# Patient Record
Sex: Male | Born: 1935 | Hispanic: Refuse to answer | Marital: Married
Health system: Southern US, Community
[De-identification: ages and names within clinical notes are randomized; demographics above are authoritative.]

---

## 2004-02-03 ENCOUNTER — Ambulatory Visit: Payer: Self-pay

## 2004-03-02 ENCOUNTER — Ambulatory Visit: Payer: Self-pay | Admitting: Urology

## 2004-03-06 ENCOUNTER — Ambulatory Visit: Payer: Self-pay | Admitting: Urology

## 2004-03-16 ENCOUNTER — Other Ambulatory Visit: Payer: Self-pay

## 2004-03-17 ENCOUNTER — Inpatient Hospital Stay: Payer: Self-pay | Admitting: Urology

## 2004-03-21 ENCOUNTER — Inpatient Hospital Stay: Payer: Self-pay | Admitting: Urology

## 2004-03-31 ENCOUNTER — Ambulatory Visit: Payer: Self-pay | Admitting: Urology

## 2004-04-07 ENCOUNTER — Ambulatory Visit: Payer: Self-pay | Admitting: Urology

## 2004-08-07 ENCOUNTER — Ambulatory Visit: Payer: Self-pay | Admitting: Urology

## 2005-05-01 ENCOUNTER — Ambulatory Visit: Payer: Self-pay | Admitting: Orthopedic Surgery

## 2006-04-25 ENCOUNTER — Inpatient Hospital Stay: Payer: Self-pay | Admitting: Internal Medicine

## 2006-05-03 ENCOUNTER — Other Ambulatory Visit: Payer: Self-pay

## 2006-05-17 ENCOUNTER — Ambulatory Visit: Payer: Self-pay | Admitting: Radiation Oncology

## 2006-06-08 ENCOUNTER — Inpatient Hospital Stay: Payer: Self-pay | Admitting: Internal Medicine

## 2006-06-15 ENCOUNTER — Ambulatory Visit: Payer: Self-pay | Admitting: Internal Medicine

## 2006-06-15 ENCOUNTER — Ambulatory Visit: Payer: Self-pay | Admitting: Radiation Oncology

## 2006-06-17 ENCOUNTER — Emergency Department: Payer: Self-pay | Admitting: Emergency Medicine

## 2006-07-16 ENCOUNTER — Ambulatory Visit: Payer: Self-pay | Admitting: Radiation Oncology

## 2006-07-16 ENCOUNTER — Ambulatory Visit: Payer: Self-pay | Admitting: Internal Medicine

## 2006-08-15 ENCOUNTER — Ambulatory Visit: Payer: Self-pay | Admitting: Radiation Oncology

## 2006-08-15 ENCOUNTER — Ambulatory Visit: Payer: Self-pay | Admitting: Internal Medicine

## 2006-09-15 ENCOUNTER — Ambulatory Visit: Payer: Self-pay | Admitting: Internal Medicine

## 2006-09-15 ENCOUNTER — Ambulatory Visit: Payer: Self-pay | Admitting: Radiation Oncology

## 2006-10-03 ENCOUNTER — Ambulatory Visit: Payer: Self-pay | Admitting: Internal Medicine

## 2006-10-15 ENCOUNTER — Ambulatory Visit: Payer: Self-pay | Admitting: Radiation Oncology

## 2006-10-15 ENCOUNTER — Ambulatory Visit: Payer: Self-pay | Admitting: Internal Medicine

## 2006-11-15 ENCOUNTER — Ambulatory Visit: Payer: Self-pay | Admitting: Radiation Oncology

## 2006-11-15 ENCOUNTER — Ambulatory Visit: Payer: Self-pay | Admitting: Internal Medicine

## 2006-12-16 ENCOUNTER — Ambulatory Visit: Payer: Self-pay | Admitting: Internal Medicine

## 2006-12-16 ENCOUNTER — Ambulatory Visit: Payer: Self-pay | Admitting: Radiation Oncology

## 2007-01-15 ENCOUNTER — Ambulatory Visit: Payer: Self-pay | Admitting: Internal Medicine

## 2007-01-15 ENCOUNTER — Ambulatory Visit: Payer: Self-pay | Admitting: Radiation Oncology

## 2007-02-15 ENCOUNTER — Ambulatory Visit: Payer: Self-pay | Admitting: Radiation Oncology

## 2007-02-15 ENCOUNTER — Ambulatory Visit: Payer: Self-pay | Admitting: Internal Medicine

## 2007-03-17 ENCOUNTER — Ambulatory Visit: Payer: Self-pay | Admitting: Internal Medicine

## 2007-03-17 ENCOUNTER — Ambulatory Visit: Payer: Self-pay | Admitting: Radiation Oncology

## 2007-04-17 ENCOUNTER — Ambulatory Visit: Payer: Self-pay | Admitting: Radiation Oncology

## 2007-05-10 IMAGING — CT CT ABD-PELV W/O CM
1 of 2 series · 15 of 32 positions shown, 19 images · non-contrast
Comparison: none

REASON FOR EXAM: (1) nephrostomy tube malfunction; (2) nephrostomy tube
malfunction
COMMENTS:

PROCEDURE:     CT  - CT ABDOMEN AND PELVIS W[DATE]  [DATE]
RESULT:
COMPARISON STUDIES:   Prior exam of 04/29/06.

[Series 2: stone · axial · 0.71mm/px · z∈[-561,-195]mm · 15 of 137 slices shown, 19 images]
[im 10/137  soft-tissue]
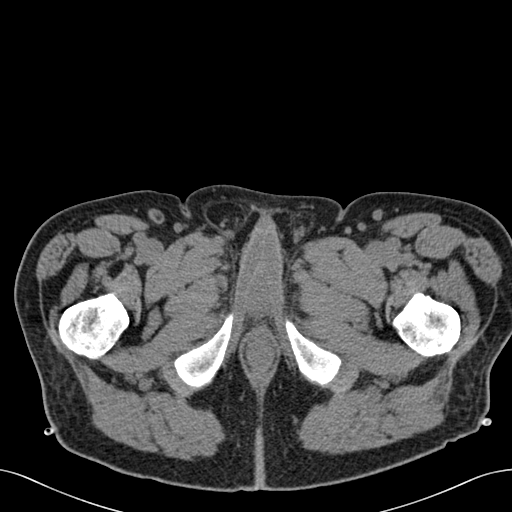
[im 10/137  bone]
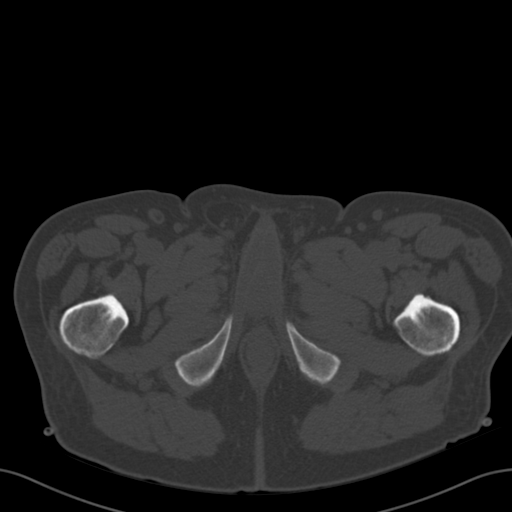
[im 19/137  soft-tissue]
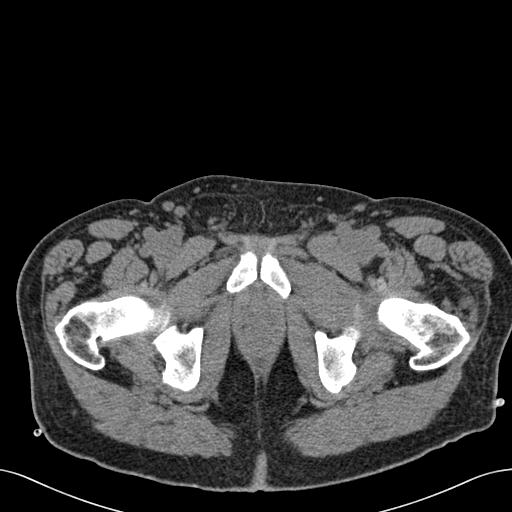
[im 29/137  soft-tissue]
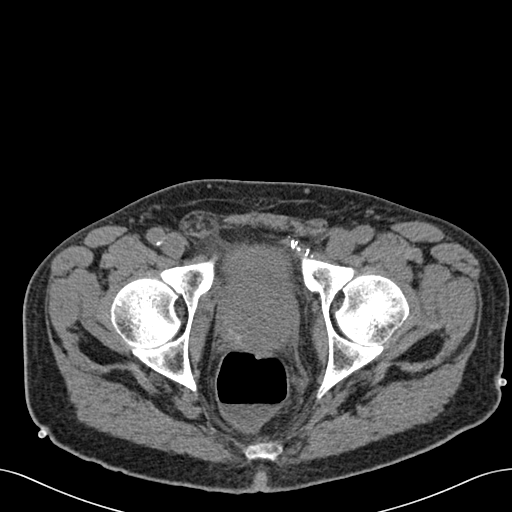
[im 38/137  soft-tissue]
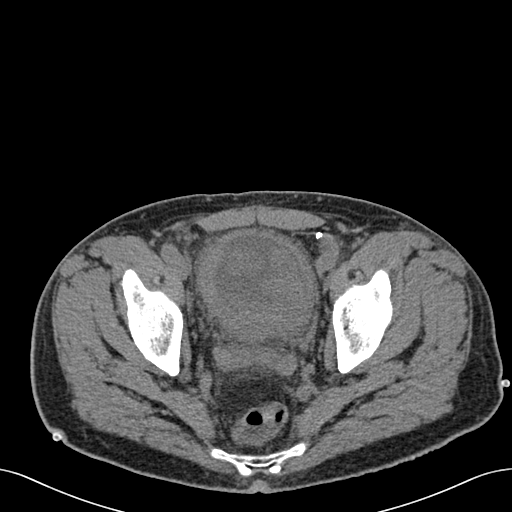
[im 47/137  soft-tissue]
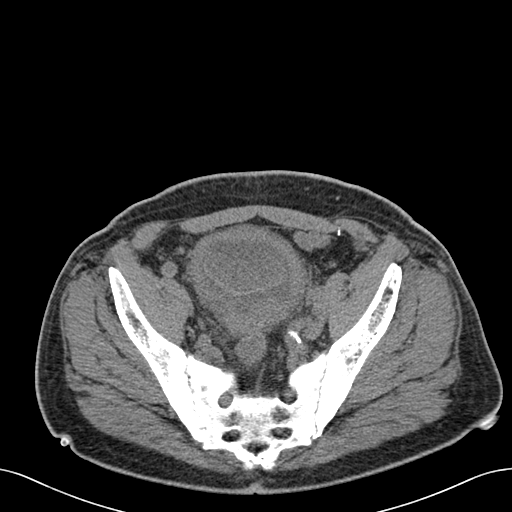
[im 57/137  soft-tissue]
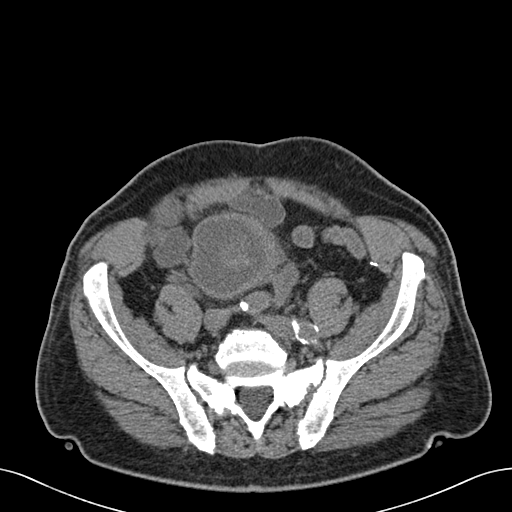
[im 71/137  soft-tissue]
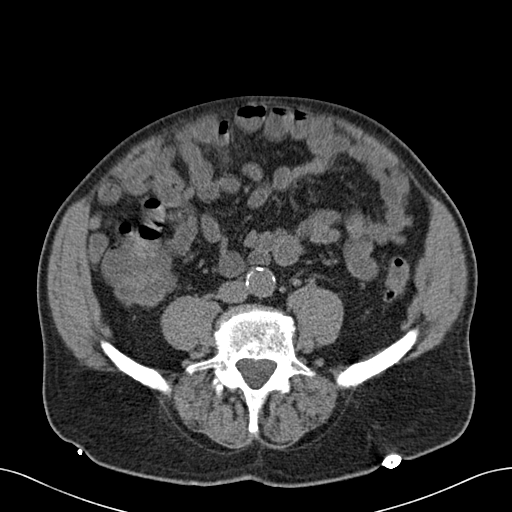
[im 80/137  soft-tissue]
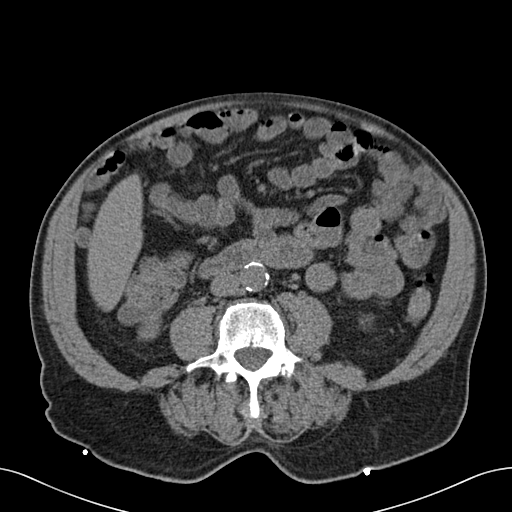
[im 90/137  soft-tissue]
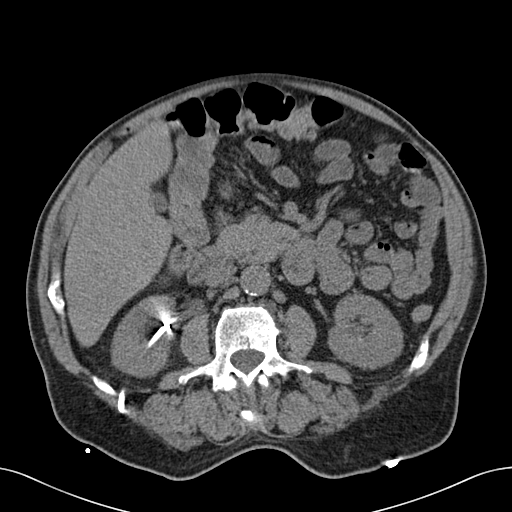
[im 90/137  bone]
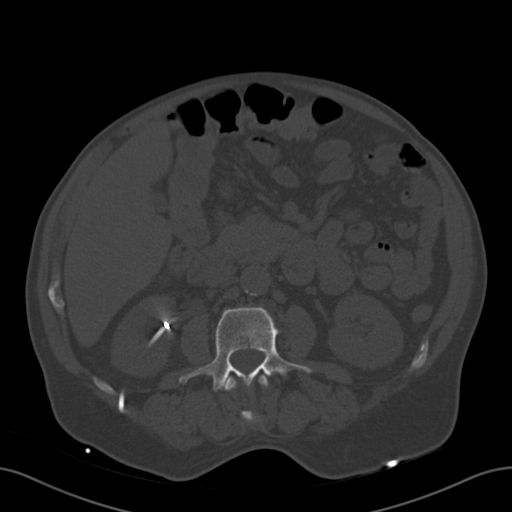
[im 99/137  soft-tissue]
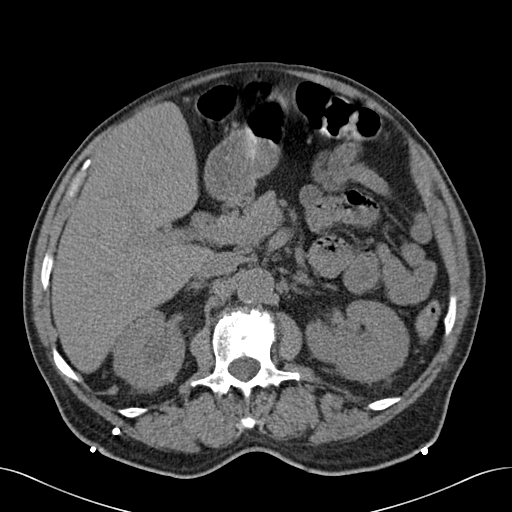
[im 108/137  soft-tissue]
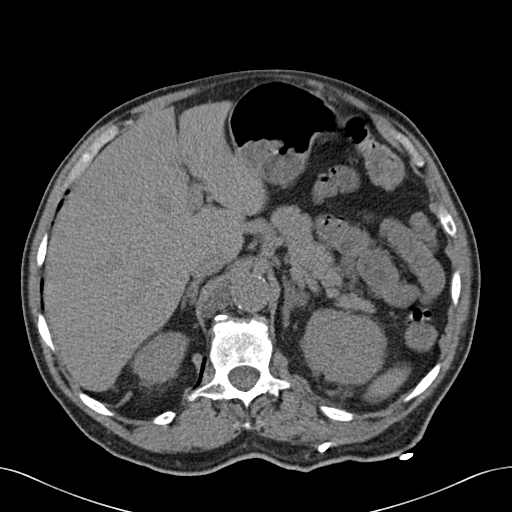
[im 118/137  soft-tissue]
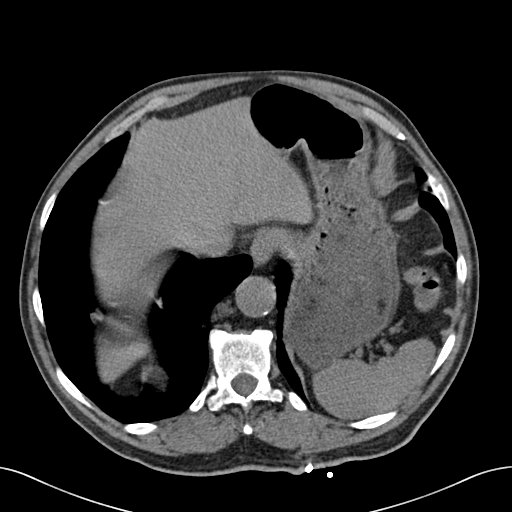
[im 118/137  lung]
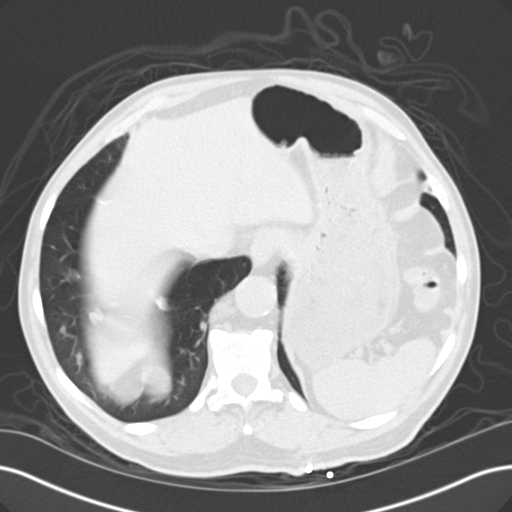
[im 122/137  lung]
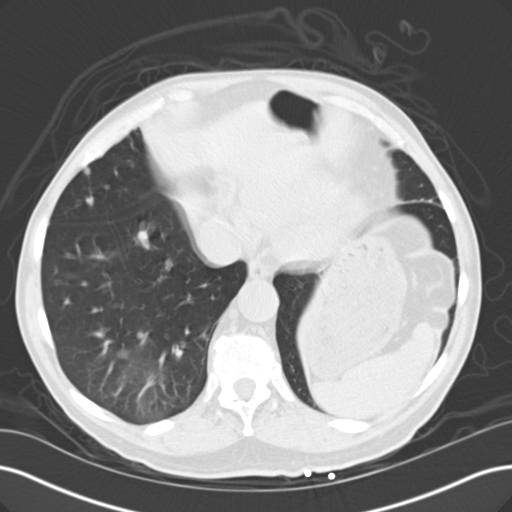
[im 127/137  soft-tissue]
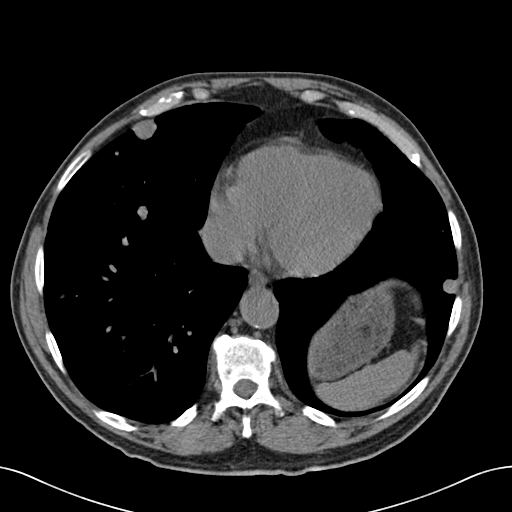
[im 127/137  lung]
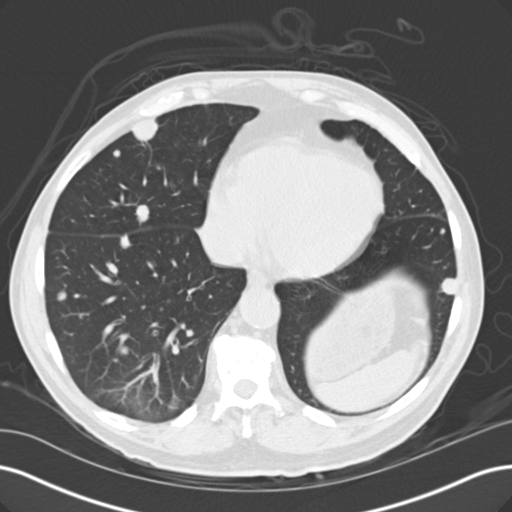
[im 132/137  lung]
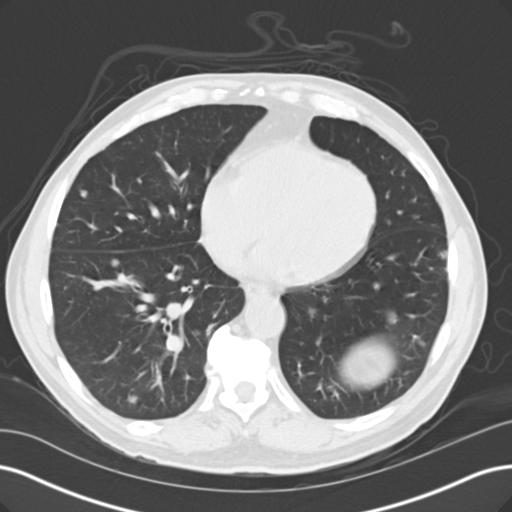

[15 of 32 positions shown; findings below may reference images not displayed]

FINDINGS: Again demonstrated are numerous bilateral pulmonary nodules the
largest of which may have decreased in size slightly when compared to the
prior exam.  A RIGHT middle lobe pulmonary nodule now measures 1.9 cm
compared to 2.5 cm on the prior exam.  A LEFT lower lobe pulmonary nodule
now measures 1.2 cm compared to 1.9 cm on the prior exam.

The unenhanced liver, spleen, pancreas and adrenal glands appear
unremarkable.

There has been interval placement of a RIGHT sided nephrostomy tube with
interval improvement in RIGHT sided hydronephrosis.  The LEFT sided
hydronephrosis has also improved significantly when compared to the prior
exam. Again demonstrated is high density within the bladder which may
represent tumor versus clot.  The bladder wall is thickened, unchanged.
There is no significant retroperitoneal lymphadenopathy.  Surgical clips in
the LEFT inguinal area are unchanged.  A large RIGHT retrocrural lymph node
is unchanged in size compared to the prior exam.  Osseous structures are
unchanged.
IMPRESSION: 1.     Interval placement of RIGHT sided nephrostomy tube with improvement
in bilateral hydronephrosis.
2.     Again demonstrated are extensive bilateral pulmonary nodules, the
largest of which are slightly decreased in size compared to the prior exam.
3.     Thickened bladder wall with extensive soft tissue within the bladder,
likely representing malignancy versus clot.

## 2007-05-18 ENCOUNTER — Ambulatory Visit: Payer: Self-pay | Admitting: Internal Medicine

## 2007-05-23 ENCOUNTER — Ambulatory Visit: Payer: Self-pay | Admitting: Internal Medicine

## 2007-06-15 ENCOUNTER — Ambulatory Visit: Payer: Self-pay | Admitting: Internal Medicine

## 2007-07-16 ENCOUNTER — Ambulatory Visit: Payer: Self-pay | Admitting: Internal Medicine

## 2007-08-13 ENCOUNTER — Other Ambulatory Visit: Payer: Self-pay

## 2007-08-13 ENCOUNTER — Ambulatory Visit: Payer: Self-pay | Admitting: Urology

## 2007-08-18 ENCOUNTER — Ambulatory Visit: Payer: Self-pay | Admitting: Urology

## 2007-09-15 ENCOUNTER — Ambulatory Visit: Payer: Self-pay | Admitting: Internal Medicine

## 2007-10-01 ENCOUNTER — Ambulatory Visit: Payer: Self-pay | Admitting: Internal Medicine

## 2007-10-15 ENCOUNTER — Ambulatory Visit: Payer: Self-pay | Admitting: Internal Medicine

## 2007-12-15 ENCOUNTER — Ambulatory Visit: Payer: Self-pay | Admitting: Urology

## 2007-12-16 ENCOUNTER — Ambulatory Visit: Payer: Self-pay | Admitting: Internal Medicine

## 2007-12-30 ENCOUNTER — Ambulatory Visit: Payer: Self-pay | Admitting: Internal Medicine

## 2008-01-15 ENCOUNTER — Ambulatory Visit: Payer: Self-pay | Admitting: Internal Medicine

## 2008-04-16 ENCOUNTER — Ambulatory Visit: Payer: Self-pay | Admitting: Internal Medicine

## 2008-05-11 ENCOUNTER — Ambulatory Visit: Payer: Self-pay | Admitting: Internal Medicine

## 2008-05-17 ENCOUNTER — Ambulatory Visit: Payer: Self-pay | Admitting: Internal Medicine

## 2008-06-14 ENCOUNTER — Ambulatory Visit: Payer: Self-pay | Admitting: Internal Medicine

## 2008-06-23 ENCOUNTER — Ambulatory Visit: Payer: Self-pay | Admitting: Internal Medicine

## 2008-06-29 ENCOUNTER — Ambulatory Visit: Payer: Self-pay | Admitting: Urology

## 2008-07-05 ENCOUNTER — Ambulatory Visit: Payer: Self-pay | Admitting: Urology

## 2008-07-15 ENCOUNTER — Ambulatory Visit: Payer: Self-pay | Admitting: Internal Medicine

## 2008-09-14 ENCOUNTER — Ambulatory Visit: Payer: Self-pay | Admitting: Internal Medicine

## 2008-09-22 ENCOUNTER — Ambulatory Visit: Payer: Self-pay | Admitting: Internal Medicine

## 2008-10-14 ENCOUNTER — Ambulatory Visit: Payer: Self-pay | Admitting: Internal Medicine

## 2009-01-14 ENCOUNTER — Ambulatory Visit: Payer: Self-pay | Admitting: Internal Medicine

## 2009-01-27 ENCOUNTER — Ambulatory Visit: Payer: Self-pay | Admitting: Internal Medicine

## 2009-02-14 ENCOUNTER — Ambulatory Visit: Payer: Self-pay | Admitting: Internal Medicine

## 2009-04-16 ENCOUNTER — Ambulatory Visit: Payer: Self-pay | Admitting: Internal Medicine

## 2009-05-12 ENCOUNTER — Ambulatory Visit: Payer: Self-pay | Admitting: Internal Medicine

## 2009-05-17 ENCOUNTER — Ambulatory Visit: Payer: Self-pay | Admitting: Internal Medicine

## 2009-05-27 ENCOUNTER — Ambulatory Visit: Payer: Self-pay | Admitting: Internal Medicine

## 2009-06-01 ENCOUNTER — Ambulatory Visit: Payer: Self-pay | Admitting: Internal Medicine

## 2009-06-09 ENCOUNTER — Ambulatory Visit: Payer: Self-pay | Admitting: Internal Medicine

## 2009-06-14 ENCOUNTER — Ambulatory Visit: Payer: Self-pay | Admitting: Internal Medicine

## 2009-06-15 ENCOUNTER — Inpatient Hospital Stay: Payer: Self-pay | Admitting: Internal Medicine

## 2009-06-29 ENCOUNTER — Ambulatory Visit: Payer: Self-pay | Admitting: Urology

## 2009-07-04 ENCOUNTER — Ambulatory Visit: Payer: Self-pay | Admitting: Urology

## 2009-07-15 ENCOUNTER — Ambulatory Visit: Payer: Self-pay | Admitting: Internal Medicine

## 2009-08-14 ENCOUNTER — Ambulatory Visit: Payer: Self-pay | Admitting: Internal Medicine

## 2009-09-14 ENCOUNTER — Ambulatory Visit: Payer: Self-pay | Admitting: Internal Medicine

## 2009-10-14 ENCOUNTER — Ambulatory Visit: Payer: Self-pay | Admitting: Internal Medicine

## 2009-10-26 LAB — PSA

## 2009-11-14 ENCOUNTER — Ambulatory Visit: Payer: Self-pay | Admitting: Internal Medicine

## 2010-01-03 ENCOUNTER — Ambulatory Visit: Payer: Self-pay | Admitting: Urology

## 2010-01-09 ENCOUNTER — Ambulatory Visit: Payer: Self-pay | Admitting: Urology

## 2010-01-10 ENCOUNTER — Ambulatory Visit: Payer: Self-pay | Admitting: Cardiovascular Disease

## 2010-01-31 ENCOUNTER — Ambulatory Visit: Payer: Self-pay | Admitting: Internal Medicine

## 2010-02-14 ENCOUNTER — Ambulatory Visit: Payer: Self-pay | Admitting: Internal Medicine

## 2010-05-02 ENCOUNTER — Ambulatory Visit: Payer: Self-pay | Admitting: Internal Medicine

## 2010-05-17 ENCOUNTER — Ambulatory Visit: Payer: Self-pay | Admitting: Internal Medicine

## 2010-06-22 ENCOUNTER — Ambulatory Visit: Payer: Self-pay | Admitting: Urology

## 2010-06-26 ENCOUNTER — Ambulatory Visit: Payer: Self-pay | Admitting: Urology

## 2010-08-31 ENCOUNTER — Ambulatory Visit: Payer: Self-pay | Admitting: Internal Medicine

## 2010-09-15 ENCOUNTER — Ambulatory Visit: Payer: Self-pay | Admitting: Internal Medicine

## 2010-10-25 ENCOUNTER — Ambulatory Visit: Payer: Self-pay | Admitting: Internal Medicine

## 2010-11-01 ENCOUNTER — Ambulatory Visit: Payer: Self-pay | Admitting: Gynecologic Oncology

## 2010-11-07 ENCOUNTER — Ambulatory Visit: Payer: Self-pay | Admitting: Internal Medicine

## 2010-11-15 ENCOUNTER — Ambulatory Visit: Payer: Self-pay | Admitting: Gynecologic Oncology

## 2010-11-22 ENCOUNTER — Ambulatory Visit: Payer: Self-pay | Admitting: Internal Medicine

## 2010-12-16 ENCOUNTER — Ambulatory Visit: Payer: Self-pay | Admitting: Gynecologic Oncology

## 2011-01-15 ENCOUNTER — Ambulatory Visit: Payer: Self-pay | Admitting: Internal Medicine

## 2011-01-15 ENCOUNTER — Ambulatory Visit: Payer: Self-pay | Admitting: Gynecologic Oncology

## 2011-01-18 ENCOUNTER — Ambulatory Visit: Payer: Self-pay | Admitting: Urology

## 2011-01-18 DIAGNOSIS — I10 Essential (primary) hypertension: Secondary | ICD-10-CM

## 2011-01-22 ENCOUNTER — Ambulatory Visit: Payer: Self-pay | Admitting: Urology

## 2011-02-15 ENCOUNTER — Ambulatory Visit: Payer: Self-pay | Admitting: Gynecologic Oncology

## 2011-02-15 ENCOUNTER — Ambulatory Visit: Payer: Self-pay | Admitting: Internal Medicine

## 2011-02-19 ENCOUNTER — Ambulatory Visit: Payer: Self-pay | Admitting: Vascular Surgery

## 2011-03-09 ENCOUNTER — Ambulatory Visit: Payer: Self-pay | Admitting: Urology

## 2011-03-09 ENCOUNTER — Inpatient Hospital Stay: Payer: Self-pay | Admitting: Internal Medicine

## 2011-03-17 ENCOUNTER — Ambulatory Visit: Payer: Self-pay | Admitting: Gynecologic Oncology

## 2011-03-17 ENCOUNTER — Ambulatory Visit: Payer: Self-pay | Admitting: Internal Medicine

## 2011-04-17 ENCOUNTER — Ambulatory Visit: Payer: Self-pay | Admitting: Gynecologic Oncology

## 2011-04-17 ENCOUNTER — Ambulatory Visit: Payer: Self-pay | Admitting: Internal Medicine

## 2011-04-19 LAB — COMPREHENSIVE METABOLIC PANEL
Alkaline Phosphatase: 97 U/L (ref 50–136)
Anion Gap: 4 — ABNORMAL LOW (ref 7–16)
BUN: 16 mg/dL (ref 7–18)
Bilirubin,Total: 0.3 mg/dL (ref 0.2–1.0)
Calcium, Total: 8.9 mg/dL (ref 8.5–10.1)
Co2: 36 mmol/L — ABNORMAL HIGH (ref 21–32)
EGFR (Non-African Amer.): >60
Glucose: 103 mg/dL — ABNORMAL HIGH (ref 65–99)
Osmolality: 283 (ref 275–301)
Potassium: 3.7 mmol/L (ref 3.5–5.1)
SGPT (ALT): 15 U/L
Sodium: 141 mmol/L (ref 136–145)
Total Protein: 7.3 g/dL (ref 6.4–8.2)

## 2011-04-19 LAB — CBC CANCER CENTER
Basophil %: 0.2 %
Eosinophil %: 0.7 %
HCT: 34.4 % — ABNORMAL LOW (ref 40.0–52.0)
Lymphocyte %: 11.6 %
MCH: 32.1 pg (ref 26.0–34.0)
MCHC: 33 g/dL (ref 32.0–36.0)
MCV: 97 fL (ref 80–100)
Monocyte #: 1 x10 3/mm — ABNORMAL HIGH (ref 0.0–0.7)
Neutrophil %: 75.4 %
RDW: 18.9 % — ABNORMAL HIGH (ref 11.5–14.5)

## 2011-05-10 LAB — COMPREHENSIVE METABOLIC PANEL
Alkaline Phosphatase: 82 U/L (ref 50–136)
Anion Gap: 5 — ABNORMAL LOW (ref 7–16)
Bilirubin,Total: 0.3 mg/dL (ref 0.2–1.0)
Calcium, Total: 9.1 mg/dL (ref 8.5–10.1)
Co2: 33 mmol/L — ABNORMAL HIGH (ref 21–32)
Creatinine: 1.49 mg/dL — ABNORMAL HIGH (ref 0.60–1.30)
EGFR (African American): 59 — ABNORMAL LOW
EGFR (Non-African Amer.): 48 — ABNORMAL LOW
Glucose: 100 mg/dL — ABNORMAL HIGH (ref 65–99)
Osmolality: 283 (ref 275–301)
Potassium: 4.1 mmol/L (ref 3.5–5.1)
SGOT(AST): 15 U/L (ref 15–37)
Sodium: 139 mmol/L (ref 136–145)

## 2011-05-10 LAB — CBC CANCER CENTER
Basophil %: 0.6 %
Eosinophil #: 0 x10 3/mm (ref 0.0–0.7)
HCT: 34.6 % — ABNORMAL LOW (ref 40.0–52.0)
HGB: 11.6 g/dL — ABNORMAL LOW (ref 13.0–18.0)
Lymphocyte #: 0.9 x10 3/mm — ABNORMAL LOW (ref 1.0–3.6)
MCH: 32.4 pg (ref 26.0–34.0)
MCHC: 33.6 g/dL (ref 32.0–36.0)
Monocyte #: 0.8 x10 3/mm — ABNORMAL HIGH (ref 0.0–0.7)
Monocyte %: 11.9 %
Neutrophil #: 5 x10 3/mm (ref 1.4–6.5)

## 2011-05-14 LAB — CREATININE, SERUM
Creatinine: 1.52 mg/dL — ABNORMAL HIGH (ref 0.60–1.30)
EGFR (African American): 57 — ABNORMAL LOW
EGFR (Non-African Amer.): 47 — ABNORMAL LOW

## 2011-05-18 ENCOUNTER — Ambulatory Visit: Payer: Self-pay | Admitting: Internal Medicine

## 2011-05-28 LAB — CBC CANCER CENTER
Basophil #: 0 x10 3/mm (ref 0.0–0.1)
Basophil %: 0.2 %
Eosinophil #: 0.1 x10 3/mm (ref 0.0–0.7)
HCT: 35.2 % — ABNORMAL LOW (ref 40.0–52.0)
HGB: 11.6 g/dL — ABNORMAL LOW (ref 13.0–18.0)
Lymphocyte %: 17.2 %
MCH: 31.8 pg (ref 26.0–34.0)
MCHC: 33 g/dL (ref 32.0–36.0)
MCV: 96 fL (ref 80–100)
Neutrophil %: 65.7 %
RBC: 3.65 10*6/uL — ABNORMAL LOW (ref 4.40–5.90)
RDW: 17.1 % — ABNORMAL HIGH (ref 11.5–14.5)
WBC: 5.2 x10 3/mm (ref 3.8–10.6)

## 2011-05-28 LAB — COMPREHENSIVE METABOLIC PANEL
Albumin: 3.4 g/dL (ref 3.4–5.0)
Alkaline Phosphatase: 75 U/L (ref 50–136)
Anion Gap: 8 (ref 7–16)
BUN: 39 mg/dL — ABNORMAL HIGH (ref 7–18)
Bilirubin,Total: 0.4 mg/dL (ref 0.2–1.0)
Creatinine: 1.74 mg/dL — ABNORMAL HIGH (ref 0.60–1.30)
Glucose: 87 mg/dL (ref 65–99)
Osmolality: 286 (ref 275–301)
Potassium: 4.4 mmol/L (ref 3.5–5.1)
Sodium: 139 mmol/L (ref 136–145)
Total Protein: 8.2 g/dL (ref 6.4–8.2)

## 2011-06-11 ENCOUNTER — Inpatient Hospital Stay: Payer: Self-pay | Admitting: Internal Medicine

## 2011-06-11 LAB — LACTATE DEHYDROGENASE: LDH: 114 U/L (ref 87–241)

## 2011-06-11 LAB — BASIC METABOLIC PANEL
Anion Gap: 12 (ref 7–16)
BUN: 94 mg/dL — ABNORMAL HIGH (ref 7–18)
BUN: 88 mg/dL — ABNORMAL HIGH (ref 7–18)
Chloride: 104 mmol/L (ref 98–107)
Co2: 24 mmol/L (ref 21–32)
Creatinine: 4.04 mg/dL — ABNORMAL HIGH (ref 0.60–1.30)
Creatinine: 3.8 mg/dL — ABNORMAL HIGH (ref 0.60–1.30)
EGFR (African American): 20 — ABNORMAL LOW
EGFR (Non-African Amer.): 15 — ABNORMAL LOW
Glucose: 95 mg/dL (ref 65–99)
Potassium: 4.8 mmol/L (ref 3.5–5.1)
Sodium: 137 mmol/L (ref 136–145)
Sodium: 140 mmol/L (ref 136–145)

## 2011-06-11 LAB — LIPID PANEL
Cholesterol: 137 mg/dL (ref 0–200)
Ldl Cholesterol, Calc: 80 mg/dL (ref 0–100)
Triglycerides: 45 mg/dL (ref 0–200)

## 2011-06-11 LAB — CBC CANCER CENTER
Basophil #: 0 x10 3/mm (ref 0.0–0.1)
Eosinophil %: 1.9 %
HGB: 11.2 g/dL — ABNORMAL LOW (ref 13.0–18.0)
Lymphocyte %: 11.7 %
Monocyte %: 16 %
Neutrophil #: 3.6 x10 3/mm (ref 1.4–6.5)
Neutrophil %: 69.9 %
Platelet: 313 x10 3/mm (ref 150–440)
RBC: 3.49 10*6/uL — ABNORMAL LOW (ref 4.40–5.90)
WBC: 5.1 x10 3/mm (ref 3.8–10.6)

## 2011-06-11 LAB — HEPATIC FUNCTION PANEL A (ARMC)
Albumin: 2.9 g/dL — ABNORMAL LOW (ref 3.4–5.0)
Alkaline Phosphatase: 78 U/L (ref 50–136)
SGOT(AST): 23 U/L (ref 15–37)
SGPT (ALT): 28 U/L
Total Protein: 8.1 g/dL (ref 6.4–8.2)

## 2011-06-11 LAB — URIC ACID: Uric Acid: 6.4 mg/dL (ref 3.5–7.2)

## 2011-06-11 LAB — HEMOGLOBIN A1C: Hemoglobin A1C: 5.9 % (ref 4.2–6.3)

## 2011-06-12 LAB — BASIC METABOLIC PANEL
Calcium, Total: 8.6 mg/dL (ref 8.5–10.1)
Chloride: 105 mmol/L (ref 98–107)
Co2: 23 mmol/L (ref 21–32)
Creatinine: 3.48 mg/dL — ABNORMAL HIGH (ref 0.60–1.30)
EGFR (African American): 22 — ABNORMAL LOW
Osmolality: 304 (ref 275–301)
Potassium: 4.6 mmol/L (ref 3.5–5.1)

## 2011-06-13 LAB — RENAL FUNCTION PANEL
Albumin: 2.4 g/dL — ABNORMAL LOW (ref 3.4–5.0)
Anion Gap: 9 (ref 7–16)
BUN: 76 mg/dL — ABNORMAL HIGH (ref 7–18)
Chloride: 107 mmol/L (ref 98–107)
Creatinine: 3.08 mg/dL — ABNORMAL HIGH (ref 0.60–1.30)
EGFR (African American): 25 — ABNORMAL LOW
Phosphorus: 4 mg/dL (ref 2.5–4.9)

## 2011-06-13 LAB — CREATININE, SERUM
Creatinine: 2.77 mg/dL — ABNORMAL HIGH (ref 0.60–1.30)
EGFR (African American): 29 — ABNORMAL LOW

## 2011-06-13 LAB — MAGNESIUM: Magnesium: 1.6 mg/dL — ABNORMAL LOW

## 2011-06-15 ENCOUNTER — Ambulatory Visit: Payer: Self-pay | Admitting: Gynecologic Oncology

## 2011-06-15 ENCOUNTER — Ambulatory Visit: Payer: Self-pay | Admitting: Internal Medicine

## 2011-06-15 LAB — MAGNESIUM: Magnesium: 1.9 mg/dL

## 2011-06-15 LAB — CREATININE, SERUM: EGFR (African American): 21 — ABNORMAL LOW

## 2011-06-15 LAB — CALCIUM: Calcium, Total: 9 mg/dL (ref 8.5–10.1)

## 2011-06-15 LAB — CANCER CENTER HEMOGLOBIN: HGB: 11.2 g/dL — ABNORMAL LOW (ref 13.0–18.0)

## 2011-06-18 LAB — BASIC METABOLIC PANEL
BUN: 73 mg/dL — ABNORMAL HIGH (ref 7–18)
Chloride: 106 mmol/L (ref 98–107)
Co2: 20 mmol/L — ABNORMAL LOW (ref 21–32)
Creatinine: 4.19 mg/dL — ABNORMAL HIGH (ref 0.60–1.30)
EGFR (African American): 18 — ABNORMAL LOW
Glucose: 77 mg/dL (ref 65–99)
Potassium: 4.7 mmol/L (ref 3.5–5.1)
Sodium: 140 mmol/L (ref 136–145)

## 2011-06-18 LAB — CREATININE, SERUM
Creatinine: 3.83 mg/dL — ABNORMAL HIGH (ref 0.60–1.30)
EGFR (African American): 20 — ABNORMAL LOW

## 2011-06-20 LAB — CBC CANCER CENTER
Basophil #: 0 x10 3/mm (ref 0.0–0.1)
Eosinophil #: 0.1 x10 3/mm (ref 0.0–0.7)
HCT: 29.1 % — ABNORMAL LOW (ref 40.0–52.0)
Lymphocyte #: 0.6 x10 3/mm — ABNORMAL LOW (ref 1.0–3.6)
MCH: 31.9 pg (ref 26.0–34.0)
MCHC: 33.4 g/dL (ref 32.0–36.0)
MCV: 95 fL (ref 80–100)
Monocyte #: 0.9 x10 3/mm — ABNORMAL HIGH (ref 0.0–0.7)
Neutrophil #: 4.4 x10 3/mm (ref 1.4–6.5)
Neutrophil %: 73.2 %
RDW: 14.8 % — ABNORMAL HIGH (ref 11.5–14.5)

## 2011-06-20 LAB — COMPREHENSIVE METABOLIC PANEL
Albumin: 2.6 g/dL — ABNORMAL LOW (ref 3.4–5.0)
Alkaline Phosphatase: 70 U/L (ref 50–136)
Anion Gap: 11 (ref 7–16)
BUN: 73 mg/dL — ABNORMAL HIGH (ref 7–18)
Bilirubin,Total: 0.2 mg/dL (ref 0.2–1.0)
Calcium, Total: 8.7 mg/dL (ref 8.5–10.1)
Creatinine: 4.02 mg/dL — ABNORMAL HIGH (ref 0.60–1.30)
Glucose: 82 mg/dL (ref 65–99)
Potassium: 5.1 mmol/L (ref 3.5–5.1)
SGPT (ALT): 27 U/L
Total Protein: 7.7 g/dL (ref 6.4–8.2)

## 2011-06-25 LAB — BASIC METABOLIC PANEL
BUN: 87 mg/dL — ABNORMAL HIGH (ref 7–18)
Chloride: 99 mmol/L (ref 98–107)
Co2: 18 mmol/L — ABNORMAL LOW (ref 21–32)
Creatinine: 4.77 mg/dL — ABNORMAL HIGH (ref 0.60–1.30)
Glucose: 86 mg/dL (ref 65–99)
Potassium: 4.5 mmol/L (ref 3.5–5.1)
Sodium: 132 mmol/L — ABNORMAL LOW (ref 136–145)

## 2011-06-28 ENCOUNTER — Inpatient Hospital Stay: Payer: Self-pay | Admitting: Internal Medicine

## 2011-06-28 LAB — COMPREHENSIVE METABOLIC PANEL
Alkaline Phosphatase: 48 U/L — ABNORMAL LOW (ref 50–136)
BUN: 96 mg/dL — ABNORMAL HIGH (ref 7–18)
Bilirubin,Total: 0.3 mg/dL (ref 0.2–1.0)
Chloride: 99 mmol/L (ref 98–107)
Co2: 17 mmol/L — ABNORMAL LOW (ref 21–32)
Creatinine: 5.49 mg/dL — ABNORMAL HIGH (ref 0.60–1.30)
Osmolality: 289 (ref 275–301)
SGPT (ALT): 18 U/L
Sodium: 130 mmol/L — ABNORMAL LOW (ref 136–145)
Total Protein: 7.9 g/dL (ref 6.4–8.2)

## 2011-06-28 LAB — URINALYSIS, COMPLETE
Bilirubin,UR: NEGATIVE
Glucose,UR: 50 mg/dL (ref 0–75)
Ketone: NEGATIVE
Ph: 8 (ref 4.5–8.0)
Squamous Epithelial: 2

## 2011-06-28 LAB — CBC WITH DIFFERENTIAL/PLATELET
Basophil %: 0.1 %
Eosinophil %: 1.1 %
HCT: 29.2 % — ABNORMAL LOW (ref 40.0–52.0)
Lymphocyte #: 0.5 10*3/uL — ABNORMAL LOW (ref 1.0–3.6)
Lymphocyte %: 7.6 %
MCV: 97 fL (ref 80–100)
RBC: 3.03 10*6/uL — ABNORMAL LOW (ref 4.40–5.90)
RDW: 13.8 % (ref 11.5–14.5)
WBC: 6 10*3/uL (ref 3.8–10.6)

## 2011-06-29 LAB — CBC WITH DIFFERENTIAL/PLATELET
Basophil %: 0.3 %
Eosinophil #: 0.1 10*3/uL (ref 0.0–0.7)
Eosinophil %: 1.2 %
HGB: 8.6 g/dL — ABNORMAL LOW (ref 13.0–18.0)
Lymphocyte %: 7.1 %
MCH: 31.7 pg (ref 26.0–34.0)
MCV: 96 fL (ref 80–100)
Monocyte #: 0.8 10*3/uL — ABNORMAL HIGH (ref 0.0–0.7)
Monocyte %: 15 %
Neutrophil #: 4.1 10*3/uL (ref 1.4–6.5)
Neutrophil %: 76.4 %

## 2011-06-29 LAB — BASIC METABOLIC PANEL
BUN: 90 mg/dL — ABNORMAL HIGH (ref 7–18)
Calcium, Total: 8.2 mg/dL — ABNORMAL LOW (ref 8.5–10.1)
Co2: 16 mmol/L — ABNORMAL LOW (ref 21–32)
EGFR (Non-African Amer.): 12 — ABNORMAL LOW
Glucose: 69 mg/dL (ref 65–99)
Osmolality: 294 (ref 275–301)
Potassium: 5 mmol/L (ref 3.5–5.1)

## 2011-06-29 LAB — OCCULT BLOOD X 1 CARD TO LAB, STOOL: Occult Blood, Feces: NEGATIVE

## 2011-06-30 LAB — BASIC METABOLIC PANEL
Anion Gap: 11 (ref 7–16)
BUN: 81 mg/dL — ABNORMAL HIGH (ref 7–18)
Calcium, Total: 8.3 mg/dL — ABNORMAL LOW (ref 8.5–10.1)
EGFR (African American): 17 — ABNORMAL LOW
EGFR (Non-African Amer.): 14 — ABNORMAL LOW
Glucose: 95 mg/dL (ref 65–99)
Potassium: 4.9 mmol/L (ref 3.5–5.1)
Sodium: 136 mmol/L (ref 136–145)

## 2011-06-30 LAB — URINE CULTURE

## 2011-07-01 LAB — BASIC METABOLIC PANEL
Calcium, Total: 8.3 mg/dL — ABNORMAL LOW (ref 8.5–10.1)
Chloride: 109 mmol/L — ABNORMAL HIGH (ref 98–107)
Co2: 19 mmol/L — ABNORMAL LOW (ref 21–32)
EGFR (African American): 21 — ABNORMAL LOW
Glucose: 57 mg/dL — ABNORMAL LOW (ref 65–99)
Potassium: 5 mmol/L (ref 3.5–5.1)
Sodium: 135 mmol/L — ABNORMAL LOW (ref 136–145)

## 2011-07-02 LAB — BASIC METABOLIC PANEL
BUN: 57 mg/dL — ABNORMAL HIGH (ref 7–18)
Chloride: 111 mmol/L — ABNORMAL HIGH (ref 98–107)
Creatinine: 3.28 mg/dL — ABNORMAL HIGH (ref 0.60–1.30)
Potassium: 4.8 mmol/L (ref 3.5–5.1)

## 2011-07-03 LAB — MAGNESIUM: Magnesium: 1.3 mg/dL — ABNORMAL LOW

## 2011-07-03 LAB — HEMOGLOBIN: HGB: 8.1 g/dL — ABNORMAL LOW (ref 13.0–18.0)

## 2011-07-04 LAB — CREATININE, SERUM
Creatinine: 3.2 mg/dL — ABNORMAL HIGH (ref 0.60–1.30)
EGFR (African American): 24 — ABNORMAL LOW

## 2011-07-04 LAB — HEMOGLOBIN: HGB: 7.7 g/dL — ABNORMAL LOW (ref 13.0–18.0)

## 2011-07-09 ENCOUNTER — Ambulatory Visit: Payer: Self-pay | Admitting: Urology

## 2011-07-16 ENCOUNTER — Ambulatory Visit: Payer: Self-pay | Admitting: Internal Medicine

## 2011-07-20 LAB — CBC CANCER CENTER
Basophil #: 0 x10 3/mm (ref 0.0–0.1)
HCT: 29.1 % — ABNORMAL LOW (ref 40.0–52.0)
Lymphocyte #: 0.9 x10 3/mm — ABNORMAL LOW (ref 1.0–3.6)
Lymphocyte %: 11.4 %
MCH: 29.8 pg (ref 26.0–34.0)
MCHC: 32.7 g/dL (ref 32.0–36.0)
Monocyte #: 1 x10 3/mm — ABNORMAL HIGH (ref 0.0–0.7)
Neutrophil #: 5.5 x10 3/mm (ref 1.4–6.5)
Neutrophil %: 73.6 %
Platelet: 337 x10 3/mm (ref 150–440)
RBC: 3.2 10*6/uL — ABNORMAL LOW (ref 4.40–5.90)
RDW: 16.8 % — ABNORMAL HIGH (ref 11.5–14.5)
WBC: 7.4 x10 3/mm (ref 3.8–10.6)

## 2011-07-20 LAB — COMPREHENSIVE METABOLIC PANEL
Anion Gap: 5 — ABNORMAL LOW (ref 7–16)
Bilirubin,Total: 0.2 mg/dL (ref 0.2–1.0)
Co2: 34 mmol/L — ABNORMAL HIGH (ref 21–32)
Creatinine: 1.79 mg/dL — ABNORMAL HIGH (ref 0.60–1.30)
EGFR (African American): 47 — ABNORMAL LOW
EGFR (Non-African Amer.): 39 — ABNORMAL LOW
Glucose: 105 mg/dL — ABNORMAL HIGH (ref 65–99)
Osmolality: 284 (ref 275–301)
Potassium: 3.7 mmol/L (ref 3.5–5.1)
SGPT (ALT): 24 U/L
Sodium: 139 mmol/L (ref 136–145)
Total Protein: 7.6 g/dL (ref 6.4–8.2)

## 2011-08-03 LAB — CBC CANCER CENTER
Eosinophil #: 0.1 x10 3/mm (ref 0.0–0.7)
HCT: 33 % — ABNORMAL LOW (ref 40.0–52.0)
HGB: 10.5 g/dL — ABNORMAL LOW (ref 13.0–18.0)
MCH: 29.5 pg (ref 26.0–34.0)
MCHC: 31.7 g/dL — ABNORMAL LOW (ref 32.0–36.0)
MCV: 93 fL (ref 80–100)
Monocyte #: 0.9 x10 3/mm (ref 0.2–1.0)
Monocyte %: 15.6 %
Neutrophil #: 4 x10 3/mm (ref 1.4–6.5)
Neutrophil %: 66.5 %
Platelet: 268 x10 3/mm (ref 150–440)
RDW: 17.3 % — ABNORMAL HIGH (ref 11.5–14.5)
WBC: 6 x10 3/mm (ref 3.8–10.6)

## 2011-08-03 LAB — BASIC METABOLIC PANEL
Anion Gap: 6 — ABNORMAL LOW (ref 7–16)
BUN: 20 mg/dL — ABNORMAL HIGH (ref 7–18)
Calcium, Total: 8.6 mg/dL (ref 8.5–10.1)
Chloride: 100 mmol/L (ref 98–107)
Co2: 36 mmol/L — ABNORMAL HIGH (ref 21–32)
Creatinine: 1.48 mg/dL — ABNORMAL HIGH (ref 0.60–1.30)
EGFR (African American): 51 — ABNORMAL LOW
EGFR (Non-African Amer.): 44 — ABNORMAL LOW
Potassium: 3.8 mmol/L (ref 3.5–5.1)

## 2011-08-15 ENCOUNTER — Ambulatory Visit: Payer: Self-pay | Admitting: Internal Medicine

## 2011-08-17 LAB — BASIC METABOLIC PANEL
Calcium, Total: 8.8 mg/dL (ref 8.5–10.1)
Co2: 32 mmol/L (ref 21–32)
Creatinine: 1.49 mg/dL — ABNORMAL HIGH (ref 0.60–1.30)
EGFR (African American): 51 — ABNORMAL LOW
Glucose: 78 mg/dL (ref 65–99)
Osmolality: 280 (ref 275–301)
Potassium: 4 mmol/L (ref 3.5–5.1)
Sodium: 138 mmol/L (ref 136–145)

## 2011-08-17 LAB — CBC CANCER CENTER
Eosinophil %: 1.6 %
HGB: 10.4 g/dL — ABNORMAL LOW (ref 13.0–18.0)
Lymphocyte #: 0.9 x10 3/mm — ABNORMAL LOW (ref 1.0–3.6)
MCV: 93 fL (ref 80–100)
Monocyte #: 0.8 x10 3/mm (ref 0.2–1.0)
Monocyte %: 12.1 %
Neutrophil #: 4.9 x10 3/mm (ref 1.4–6.5)
Neutrophil %: 71.8 %
Platelet: 345 x10 3/mm (ref 150–440)
WBC: 6.8 x10 3/mm (ref 3.8–10.6)

## 2011-09-14 LAB — HEPATIC FUNCTION PANEL A (ARMC)
Albumin: 3 g/dL — ABNORMAL LOW (ref 3.4–5.0)
Bilirubin, Direct: 0.1 mg/dL (ref 0.00–0.20)
Bilirubin,Total: 0.5 mg/dL (ref 0.2–1.0)
SGOT(AST): 18 U/L (ref 15–37)
SGPT (ALT): 17 U/L
Total Protein: 8.2 g/dL (ref 6.4–8.2)

## 2011-09-14 LAB — CREATININE, SERUM
Creatinine: 1.78 mg/dL — ABNORMAL HIGH (ref 0.60–1.30)
EGFR (African American): 41 — ABNORMAL LOW
EGFR (Non-African Amer.): 35 — ABNORMAL LOW

## 2011-09-14 LAB — CANCER CENTER HEMOGLOBIN: HGB: 11.2 g/dL — ABNORMAL LOW (ref 13.0–18.0)

## 2011-09-15 ENCOUNTER — Ambulatory Visit: Payer: Self-pay | Admitting: Internal Medicine

## 2011-09-19 LAB — CBC CANCER CENTER
Basophil %: 0.6 %
Eosinophil %: 1.7 %
HCT: 33 % — ABNORMAL LOW (ref 40.0–52.0)
Lymphocyte #: 0.9 x10 3/mm — ABNORMAL LOW (ref 1.0–3.6)
Lymphocyte %: 13.6 %
MCH: 30 pg (ref 26.0–34.0)
MCHC: 32.2 g/dL (ref 32.0–36.0)
MCV: 93 fL (ref 80–100)
Monocyte #: 0.8 x10 3/mm (ref 0.2–1.0)
Monocyte %: 12.4 %
Neutrophil %: 71.7 %
RDW: 16.8 % — ABNORMAL HIGH (ref 11.5–14.5)

## 2011-09-19 LAB — CREATININE, SERUM: EGFR (African American): 44 — ABNORMAL LOW

## 2011-09-19 LAB — POTASSIUM: Potassium: 4.1 mmol/L (ref 3.5–5.1)

## 2011-10-05 LAB — CBC CANCER CENTER
Basophil #: 0 x10 3/mm (ref 0.0–0.1)
Basophil %: 0.6 %
Eosinophil #: 0 x10 3/mm (ref 0.0–0.7)
HGB: 9.8 g/dL — ABNORMAL LOW (ref 13.0–18.0)
Lymphocyte #: 0.8 x10 3/mm — ABNORMAL LOW (ref 1.0–3.6)
Lymphocyte %: 42.6 %
MCH: 29.6 pg (ref 26.0–34.0)
MCV: 93 fL (ref 80–100)
Monocyte #: 0.8 x10 3/mm (ref 0.2–1.0)
Monocyte %: 43 %
Neutrophil %: 12 %
Platelet: 262 x10 3/mm (ref 150–440)
RDW: 15.6 % — ABNORMAL HIGH (ref 11.5–14.5)

## 2011-10-05 LAB — CREATININE, SERUM
Creatinine: 2.11 mg/dL — ABNORMAL HIGH (ref 0.60–1.30)
EGFR (African American): 33 — ABNORMAL LOW
EGFR (Non-African Amer.): 29 — ABNORMAL LOW

## 2011-10-05 LAB — MAGNESIUM: Magnesium: 1.9 mg/dL

## 2011-10-08 LAB — CBC CANCER CENTER
Eosinophil #: 0 x10 3/mm (ref 0.0–0.7)
HCT: 29.6 % — ABNORMAL LOW (ref 40.0–52.0)
Lymphocyte #: 0.8 x10 3/mm — ABNORMAL LOW (ref 1.0–3.6)
Lymphocyte %: 22.6 %
MCH: 29.6 pg (ref 26.0–34.0)
MCHC: 31.9 g/dL — ABNORMAL LOW (ref 32.0–36.0)
Monocyte #: 1.2 x10 3/mm — ABNORMAL HIGH (ref 0.2–1.0)
Neutrophil #: 1.5 x10 3/mm (ref 1.4–6.5)
Neutrophil %: 42.7 %
Platelet: 348 x10 3/mm (ref 150–440)
RDW: 15.6 % — ABNORMAL HIGH (ref 11.5–14.5)

## 2011-10-08 LAB — HEPATIC FUNCTION PANEL A (ARMC)
Bilirubin,Total: 0.2 mg/dL (ref 0.2–1.0)
SGOT(AST): 13 U/L — ABNORMAL LOW (ref 15–37)
SGPT (ALT): 18 U/L
Total Protein: 8 g/dL (ref 6.4–8.2)

## 2011-10-08 LAB — CREATININE, SERUM: EGFR (African American): 32 — ABNORMAL LOW

## 2011-10-15 ENCOUNTER — Ambulatory Visit: Payer: Self-pay | Admitting: Internal Medicine

## 2011-10-15 LAB — CREATININE, SERUM
Creatinine: 2 mg/dL — ABNORMAL HIGH (ref 0.60–1.30)
EGFR (Non-African Amer.): 31 — ABNORMAL LOW

## 2011-10-15 LAB — CBC CANCER CENTER
Basophil #: 0 x10 3/mm (ref 0.0–0.1)
Eosinophil %: 0.3 %
Lymphocyte #: 0.8 x10 3/mm — ABNORMAL LOW (ref 1.0–3.6)
Lymphocyte %: 8.8 %
MCH: 29.4 pg (ref 26.0–34.0)
MCV: 91 fL (ref 80–100)
Monocyte #: 1.1 x10 3/mm — ABNORMAL HIGH (ref 0.2–1.0)
Monocyte %: 12.8 %
Platelet: 354 x10 3/mm (ref 150–440)
RDW: 16 % — ABNORMAL HIGH (ref 11.5–14.5)
WBC: 8.7 x10 3/mm (ref 3.8–10.6)

## 2011-10-22 LAB — CBC CANCER CENTER
Basophil #: 0 x10 3/mm (ref 0.0–0.1)
Eosinophil #: 0 x10 3/mm (ref 0.0–0.7)
HCT: 28.3 % — ABNORMAL LOW (ref 40.0–52.0)
HGB: 9.5 g/dL — ABNORMAL LOW (ref 13.0–18.0)
Lymphocyte #: 0.8 x10 3/mm — ABNORMAL LOW (ref 1.0–3.6)
Lymphocyte %: 21.3 %
MCH: 30.5 pg (ref 26.0–34.0)
MCHC: 33.6 g/dL (ref 32.0–36.0)
MCV: 91 fL (ref 80–100)
Monocyte %: 8.1 %
Neutrophil %: 69.8 %
Platelet: 227 x10 3/mm (ref 150–440)
RDW: 15.4 % — ABNORMAL HIGH (ref 11.5–14.5)
WBC: 3.6 x10 3/mm — ABNORMAL LOW (ref 3.8–10.6)

## 2011-10-22 LAB — CREATININE, SERUM
EGFR (African American): 36 — ABNORMAL LOW
EGFR (Non-African Amer.): 31 — ABNORMAL LOW

## 2011-10-22 LAB — MAGNESIUM: Magnesium: 1.8 mg/dL

## 2011-10-26 LAB — CBC CANCER CENTER
Basophil %: 0.1 %
Eosinophil #: 0.1 x10 3/mm (ref 0.0–0.7)
Eosinophil %: 0.3 %
HCT: 29.2 % — ABNORMAL LOW (ref 40.0–52.0)
HGB: 9.3 g/dL — ABNORMAL LOW (ref 13.0–18.0)
Lymphocyte %: 2.7 %
MCH: 29 pg (ref 26.0–34.0)
MCHC: 31.8 g/dL — ABNORMAL LOW (ref 32.0–36.0)
Monocyte #: 0.2 x10 3/mm (ref 0.2–1.0)
Monocyte %: 0.9 %
Neutrophil #: 17 x10 3/mm — ABNORMAL HIGH (ref 1.4–6.5)
Neutrophil %: 96 %
RBC: 3.2 10*6/uL — ABNORMAL LOW (ref 4.40–5.90)

## 2011-10-29 LAB — CBC CANCER CENTER
Basophil #: 0 x10 3/mm (ref 0.0–0.1)
Basophil %: 0.1 %
Eosinophil #: 0 x10 3/mm (ref 0.0–0.7)
Eosinophil %: 0 %
HGB: 8.2 g/dL — ABNORMAL LOW (ref 13.0–18.0)
Lymphocyte #: 0.6 x10 3/mm — ABNORMAL LOW (ref 1.0–3.6)
Lymphocyte %: 8.6 %
MCH: 29 pg (ref 26.0–34.0)
MCV: 91 fL (ref 80–100)
Monocyte %: 2.8 %
Neutrophil #: 5.8 x10 3/mm (ref 1.4–6.5)
Platelet: 35 x10 3/mm — ABNORMAL LOW (ref 150–440)
RBC: 2.85 10*6/uL — ABNORMAL LOW (ref 4.40–5.90)
WBC: 6.6 x10 3/mm (ref 3.8–10.6)

## 2011-11-05 LAB — CBC CANCER CENTER
Basophil %: 0.2 %
Eosinophil #: 0 x10 3/mm (ref 0.0–0.7)
HGB: 8.1 g/dL — ABNORMAL LOW (ref 13.0–18.0)
Lymphocyte #: 1.3 x10 3/mm (ref 1.0–3.6)
MCH: 28.7 pg (ref 26.0–34.0)
MCV: 91 fL (ref 80–100)
Monocyte #: 2.1 x10 3/mm — ABNORMAL HIGH (ref 0.2–1.0)
Neutrophil %: 85.4 %
Platelet: 187 x10 3/mm (ref 150–440)
RBC: 2.83 10*6/uL — ABNORMAL LOW (ref 4.40–5.90)
RDW: 15.2 % — ABNORMAL HIGH (ref 11.5–14.5)

## 2011-11-05 LAB — POTASSIUM: Potassium: 4.3 mmol/L (ref 3.5–5.1)

## 2011-11-05 LAB — HEPATIC FUNCTION PANEL A (ARMC)
Albumin: 2.8 g/dL — ABNORMAL LOW (ref 3.4–5.0)
Alkaline Phosphatase: 93 U/L (ref 50–136)
Bilirubin,Total: 0.2 mg/dL (ref 0.2–1.0)
SGPT (ALT): 17 U/L

## 2011-11-05 LAB — CREATININE, SERUM
EGFR (African American): 20 — ABNORMAL LOW
EGFR (Non-African Amer.): 17 — ABNORMAL LOW

## 2011-11-05 LAB — CALCIUM: Calcium, Total: 8.7 mg/dL (ref 8.5–10.1)

## 2011-11-08 LAB — CBC CANCER CENTER
Basophil #: 0.1 x10 3/mm (ref 0.0–0.1)
Eosinophil #: 0 x10 3/mm (ref 0.0–0.7)
HGB: 8.8 g/dL — ABNORMAL LOW (ref 13.0–18.0)
Lymphocyte #: 1.1 x10 3/mm (ref 1.0–3.6)
MCH: 29.1 pg (ref 26.0–34.0)
MCV: 91 fL (ref 80–100)
Monocyte #: 1.9 x10 3/mm — ABNORMAL HIGH (ref 0.2–1.0)
Neutrophil %: 83.9 %
Platelet: 386 x10 3/mm (ref 150–440)
RBC: 3.03 10*6/uL — ABNORMAL LOW (ref 4.40–5.90)
RDW: 15.6 % — ABNORMAL HIGH (ref 11.5–14.5)

## 2011-11-08 LAB — CREATININE, SERUM
Creatinine: 2.93 mg/dL — ABNORMAL HIGH (ref 0.60–1.30)
EGFR (African American): 23 — ABNORMAL LOW

## 2011-11-14 LAB — CBC CANCER CENTER
Basophil #: 0.1 x10 3/mm (ref 0.0–0.1)
Basophil %: 0.6 %
Eosinophil #: 0 x10 3/mm (ref 0.0–0.7)
HCT: 26.2 % — ABNORMAL LOW (ref 40.0–52.0)
HGB: 8.7 g/dL — ABNORMAL LOW (ref 13.0–18.0)
Lymphocyte #: 0.9 x10 3/mm — ABNORMAL LOW (ref 1.0–3.6)
Lymphocyte %: 8.2 %
MCH: 30.8 pg (ref 26.0–34.0)
MCHC: 33.4 g/dL (ref 32.0–36.0)
Monocyte #: 1.5 x10 3/mm — ABNORMAL HIGH (ref 0.2–1.0)
Neutrophil #: 8.3 x10 3/mm — ABNORMAL HIGH (ref 1.4–6.5)
Neutrophil %: 77.1 %
Platelet: 487 x10 3/mm — ABNORMAL HIGH (ref 150–440)
RDW: 16.5 % — ABNORMAL HIGH (ref 11.5–14.5)

## 2011-11-14 LAB — COMPREHENSIVE METABOLIC PANEL
Albumin: 2.8 g/dL — ABNORMAL LOW (ref 3.4–5.0)
Alkaline Phosphatase: 77 U/L (ref 50–136)
Anion Gap: 8 (ref 7–16)
BUN: 40 mg/dL — ABNORMAL HIGH (ref 7–18)
Calcium, Total: 9.1 mg/dL (ref 8.5–10.1)
Chloride: 102 mmol/L (ref 98–107)
EGFR (Non-African Amer.): 26 — ABNORMAL LOW
Glucose: 104 mg/dL — ABNORMAL HIGH (ref 65–99)
Potassium: 4.5 mmol/L (ref 3.5–5.1)
SGOT(AST): 14 U/L — ABNORMAL LOW (ref 15–37)
SGPT (ALT): 15 U/L
Sodium: 138 mmol/L (ref 136–145)

## 2011-11-14 LAB — MAGNESIUM: Magnesium: 1.5 mg/dL — ABNORMAL LOW

## 2011-11-15 ENCOUNTER — Ambulatory Visit: Payer: Self-pay | Admitting: Internal Medicine

## 2011-11-21 LAB — CREATININE, SERUM
EGFR (African American): 30 — ABNORMAL LOW
EGFR (Non-African Amer.): 26 — ABNORMAL LOW

## 2011-11-21 LAB — CBC CANCER CENTER
Basophil #: 0.1 x10 3/mm (ref 0.0–0.1)
Eosinophil #: 0.1 x10 3/mm (ref 0.0–0.7)
Eosinophil %: 1.1 %
Lymphocyte #: 0.7 x10 3/mm — ABNORMAL LOW (ref 1.0–3.6)
Lymphocyte %: 11.2 %
MCV: 94 fL (ref 80–100)
Monocyte %: 16 %
Neutrophil %: 70.2 %
Platelet: 348 x10 3/mm (ref 150–440)
RBC: 2.83 10*6/uL — ABNORMAL LOW (ref 4.40–5.90)
RDW: 18.9 % — ABNORMAL HIGH (ref 11.5–14.5)
WBC: 6.4 x10 3/mm (ref 3.8–10.6)

## 2011-11-28 LAB — CBC CANCER CENTER
Basophil #: 0 x10 3/mm (ref 0.0–0.1)
Basophil %: 1.1 %
Eosinophil #: 0 x10 3/mm (ref 0.0–0.7)
Eosinophil %: 0.6 %
HGB: 7.5 g/dL — ABNORMAL LOW (ref 13.0–18.0)
Lymphocyte %: 15.3 %
MCHC: 32.5 g/dL (ref 32.0–36.0)
MCV: 94 fL (ref 80–100)
Monocyte #: 0.3 x10 3/mm (ref 0.2–1.0)
Neutrophil %: 74.4 %
Platelet: 227 x10 3/mm (ref 150–440)
RBC: 2.45 10*6/uL — ABNORMAL LOW (ref 4.40–5.90)
WBC: 3.8 x10 3/mm (ref 3.8–10.6)

## 2011-11-28 LAB — CREATININE, SERUM
Creatinine: 2.58 mg/dL — ABNORMAL HIGH (ref 0.60–1.30)
EGFR (African American): 26 — ABNORMAL LOW
EGFR (Non-African Amer.): 23 — ABNORMAL LOW

## 2011-12-03 LAB — CBC CANCER CENTER
Basophil #: 0 x10 3/mm (ref 0.0–0.1)
Basophil %: 0.5 %
Lymphocyte #: 0.7 x10 3/mm — ABNORMAL LOW (ref 1.0–3.6)
MCV: 95 fL (ref 80–100)
Monocyte #: 0.8 x10 3/mm (ref 0.2–1.0)
Monocyte %: 10.3 %
Platelet: 160 x10 3/mm (ref 150–440)
RDW: 17.9 % — ABNORMAL HIGH (ref 11.5–14.5)
WBC: 7.7 x10 3/mm (ref 3.8–10.6)

## 2011-12-05 LAB — COMPREHENSIVE METABOLIC PANEL
Albumin: 3 g/dL — ABNORMAL LOW (ref 3.4–5.0)
Alkaline Phosphatase: 76 U/L (ref 50–136)
Anion Gap: 9 (ref 7–16)
BUN: 61 mg/dL — ABNORMAL HIGH (ref 7–18)
Calcium, Total: 8.7 mg/dL (ref 8.5–10.1)
Chloride: 104 mmol/L (ref 98–107)
Co2: 26 mmol/L (ref 21–32)
Glucose: 84 mg/dL (ref 65–99)
Osmolality: 294 (ref 275–301)
SGOT(AST): 13 U/L — ABNORMAL LOW (ref 15–37)
SGPT (ALT): 15 U/L (ref 12–78)
Total Protein: 7.7 g/dL (ref 6.4–8.2)

## 2011-12-05 LAB — CBC CANCER CENTER
Basophil #: 0.1 x10 3/mm (ref 0.0–0.1)
Eosinophil #: 0.1 x10 3/mm (ref 0.0–0.7)
HCT: 31.9 % — ABNORMAL LOW (ref 40.0–52.0)
HGB: 10.3 g/dL — ABNORMAL LOW (ref 13.0–18.0)
Lymphocyte %: 6 %
MCH: 30.2 pg (ref 26.0–34.0)
MCHC: 32.2 g/dL (ref 32.0–36.0)
Monocyte #: 0.8 x10 3/mm (ref 0.2–1.0)
Monocyte %: 8.4 %
Neutrophil %: 84.3 %
Platelet: 182 x10 3/mm (ref 150–440)
RDW: 17.7 % — ABNORMAL HIGH (ref 11.5–14.5)

## 2011-12-05 LAB — CREATININE, SERUM: EGFR (Non-African Amer.): 21 — ABNORMAL LOW

## 2011-12-12 LAB — CBC CANCER CENTER
Basophil #: 0 x10 3/mm (ref 0.0–0.1)
Eosinophil %: 0.1 %
HCT: 30.5 % — ABNORMAL LOW (ref 40.0–52.0)
Lymphocyte #: 0.5 x10 3/mm — ABNORMAL LOW (ref 1.0–3.6)
MCH: 30.9 pg (ref 26.0–34.0)
Monocyte #: 0.4 x10 3/mm (ref 0.2–1.0)
Neutrophil %: 67.1 %
Platelet: 204 x10 3/mm (ref 150–440)
RDW: 17 % — ABNORMAL HIGH (ref 11.5–14.5)
WBC: 2.8 x10 3/mm — ABNORMAL LOW (ref 3.8–10.6)

## 2011-12-12 LAB — URINALYSIS, COMPLETE
Glucose,UR: NEGATIVE mg/dL (ref 0–75)
Protein: >=500
RBC,UR: 1616 /HPF (ref 0–5)
Specific Gravity: 1.014 (ref 1.003–1.030)
Squamous Epithelial: NONE SEEN

## 2011-12-12 LAB — BASIC METABOLIC PANEL
BUN: 91 mg/dL — ABNORMAL HIGH (ref 7–18)
Creatinine: 4.27 mg/dL — ABNORMAL HIGH (ref 0.60–1.30)
EGFR (Non-African Amer.): 12 — ABNORMAL LOW
Glucose: 90 mg/dL (ref 65–99)
Osmolality: 294 (ref 275–301)
Potassium: 5 mmol/L (ref 3.5–5.1)
Sodium: 133 mmol/L — ABNORMAL LOW (ref 136–145)

## 2011-12-12 LAB — URIC ACID: Uric Acid: 6.5 mg/dL (ref 3.5–7.2)

## 2011-12-13 LAB — URINE CULTURE

## 2011-12-16 ENCOUNTER — Ambulatory Visit: Payer: Self-pay | Admitting: Internal Medicine

## 2011-12-19 ENCOUNTER — Inpatient Hospital Stay: Payer: Self-pay | Admitting: Internal Medicine

## 2011-12-19 LAB — CBC CANCER CENTER
Basophil #: 0 x10 3/mm (ref 0.0–0.1)
Eosinophil %: 0.1 %
HCT: 37.7 % — ABNORMAL LOW (ref 40.0–52.0)
Lymphocyte #: 0.3 x10 3/mm — ABNORMAL LOW (ref 1.0–3.6)
MCH: 30.5 pg (ref 26.0–34.0)
MCV: 95 fL (ref 80–100)
Monocyte #: 0.5 x10 3/mm (ref 0.2–1.0)
Monocyte %: 4.4 %
Neutrophil #: 10.7 x10 3/mm — ABNORMAL HIGH (ref 1.4–6.5)
RBC: 3.95 10*6/uL — ABNORMAL LOW (ref 4.40–5.90)
RDW: 17 % — ABNORMAL HIGH (ref 11.5–14.5)
WBC: 11.5 x10 3/mm — ABNORMAL HIGH (ref 3.8–10.6)

## 2011-12-19 LAB — BASIC METABOLIC PANEL
Anion Gap: 19 — ABNORMAL HIGH (ref 7–16)
Calcium, Total: 9.8 mg/dL (ref 8.5–10.1)
Creatinine: 9.31 mg/dL — ABNORMAL HIGH (ref 0.60–1.30)
EGFR (African American): 6 — ABNORMAL LOW
EGFR (Non-African Amer.): 5 — ABNORMAL LOW

## 2011-12-20 LAB — BASIC METABOLIC PANEL
Anion Gap: 11 (ref 7–16)
Calcium, Total: 8.2 mg/dL — ABNORMAL LOW (ref 8.5–10.1)
Co2: 28 mmol/L (ref 21–32)
EGFR (African American): 12 — ABNORMAL LOW
EGFR (Non-African Amer.): 10 — ABNORMAL LOW
Glucose: 111 mg/dL — ABNORMAL HIGH (ref 65–99)
Osmolality: 284 (ref 275–301)
Sodium: 134 mmol/L — ABNORMAL LOW (ref 136–145)

## 2011-12-21 LAB — PHOSPHORUS: Phosphorus: 3.8 mg/dL (ref 2.5–4.9)

## 2011-12-22 LAB — CBC WITH DIFFERENTIAL/PLATELET
Basophil #: 0 10*3/uL (ref 0.0–0.1)
Basophil %: 0.4 %
Eosinophil #: 0 10*3/uL (ref 0.0–0.7)
HCT: 22 % — ABNORMAL LOW (ref 40.0–52.0)
Lymphocyte %: 7.7 %
MCH: 31.5 pg (ref 26.0–34.0)
MCHC: 33.5 g/dL (ref 32.0–36.0)
Monocyte #: 1.5 x10 3/mm — ABNORMAL HIGH (ref 0.2–1.0)
Neutrophil #: 5.9 10*3/uL (ref 1.4–6.5)
Neutrophil %: 72.5 %
Platelet: 217 10*3/uL (ref 150–440)
RDW: 16.7 % — ABNORMAL HIGH (ref 11.5–14.5)
WBC: 8.1 10*3/uL (ref 3.8–10.6)

## 2011-12-22 LAB — IRON AND TIBC
Iron Bind.Cap.(Total): 165 ug/dL — ABNORMAL LOW (ref 250–450)
Iron Saturation: 15 %
Iron: 25 ug/dL — ABNORMAL LOW (ref 65–175)
Unbound Iron-Bind.Cap.: 140 ug/dL

## 2011-12-22 LAB — BILIRUBIN, TOTAL: Bilirubin,Total: 0.3 mg/dL (ref 0.2–1.0)

## 2011-12-22 LAB — PHOSPHORUS: Phosphorus: 2.6 mg/dL (ref 2.5–4.9)

## 2011-12-23 LAB — CBC WITH DIFFERENTIAL/PLATELET
Basophil #: 0 10*3/uL (ref 0.0–0.1)
Basophil %: 0.4 %
Eosinophil #: 0.1 10*3/uL (ref 0.0–0.7)
HCT: 30.5 % — ABNORMAL LOW (ref 40.0–52.0)
Lymphocyte #: 0.7 10*3/uL — ABNORMAL LOW (ref 1.0–3.6)
Lymphocyte %: 9.3 %
MCH: 31.2 pg (ref 26.0–34.0)
MCHC: 34 g/dL (ref 32.0–36.0)
Monocyte %: 20.5 %
Neutrophil #: 5.2 10*3/uL (ref 1.4–6.5)
RDW: 16.5 % — ABNORMAL HIGH (ref 11.5–14.5)
WBC: 7.6 10*3/uL (ref 3.8–10.6)

## 2011-12-23 LAB — BASIC METABOLIC PANEL
BUN: 31 mg/dL — ABNORMAL HIGH (ref 7–18)
Creatinine: 2.66 mg/dL — ABNORMAL HIGH (ref 0.60–1.30)
EGFR (African American): 25 — ABNORMAL LOW
EGFR (Non-African Amer.): 22 — ABNORMAL LOW
Glucose: 79 mg/dL (ref 65–99)
Potassium: 3.8 mmol/L (ref 3.5–5.1)
Sodium: 134 mmol/L — ABNORMAL LOW (ref 136–145)

## 2011-12-24 ENCOUNTER — Ambulatory Visit: Payer: Self-pay | Admitting: Urology

## 2011-12-25 LAB — BASIC METABOLIC PANEL
Anion Gap: 9 (ref 7–16)
BUN: 39 mg/dL — ABNORMAL HIGH (ref 7–18)
Chloride: 99 mmol/L (ref 98–107)
Creatinine: 2.48 mg/dL — ABNORMAL HIGH (ref 0.60–1.30)
EGFR (Non-African Amer.): 24 — ABNORMAL LOW
Glucose: 95 mg/dL (ref 65–99)
Osmolality: 279 (ref 275–301)
Sodium: 135 mmol/L — ABNORMAL LOW (ref 136–145)

## 2011-12-26 LAB — POTASSIUM: Potassium: 4 mmol/L (ref 3.5–5.1)

## 2011-12-26 LAB — CREATININE, SERUM: Creatinine: 2.12 mg/dL — ABNORMAL HIGH (ref 0.60–1.30)

## 2011-12-27 LAB — BASIC METABOLIC PANEL
Anion Gap: 6 — ABNORMAL LOW (ref 7–16)
BUN: 33 mg/dL — ABNORMAL HIGH (ref 7–18)
Calcium, Total: 8.1 mg/dL — ABNORMAL LOW (ref 8.5–10.1)
Chloride: 104 mmol/L (ref 98–107)
Co2: 27 mmol/L (ref 21–32)
Creatinine: 1.91 mg/dL — ABNORMAL HIGH (ref 0.60–1.30)
EGFR (African American): 38 — ABNORMAL LOW
EGFR (Non-African Amer.): 33 — ABNORMAL LOW
Glucose: 86 mg/dL (ref 65–99)
Sodium: 137 mmol/L (ref 136–145)

## 2012-01-02 LAB — CBC CANCER CENTER
Basophil #: 0.1 x10 3/mm (ref 0.0–0.1)
Basophil %: 0.9 %
Eosinophil %: 0.9 %
HCT: 36.1 % — ABNORMAL LOW (ref 40.0–52.0)
HGB: 11.4 g/dL — ABNORMAL LOW (ref 13.0–18.0)
Lymphocyte #: 0.9 x10 3/mm — ABNORMAL LOW (ref 1.0–3.6)
Lymphocyte %: 9.5 %
MCH: 30.1 pg (ref 26.0–34.0)
Monocyte %: 10 %
Neutrophil #: 7.4 x10 3/mm — ABNORMAL HIGH (ref 1.4–6.5)
RDW: 16.2 % — ABNORMAL HIGH (ref 11.5–14.5)
WBC: 9.4 x10 3/mm (ref 3.8–10.6)

## 2012-01-02 LAB — BASIC METABOLIC PANEL
Anion Gap: 1 — ABNORMAL LOW (ref 7–16)
Calcium, Total: 8.5 mg/dL (ref 8.5–10.1)
Co2: 35 mmol/L — ABNORMAL HIGH (ref 21–32)
EGFR (African American): 43 — ABNORMAL LOW
EGFR (Non-African Amer.): 37 — ABNORMAL LOW
Glucose: 74 mg/dL (ref 65–99)
Osmolality: 279 (ref 275–301)

## 2012-01-14 LAB — CBC CANCER CENTER
Basophil #: 0.1 x10 3/mm (ref 0.0–0.1)
Basophil %: 0.9 %
Eosinophil #: 0 x10 3/mm (ref 0.0–0.7)
Eosinophil %: 0.4 %
HCT: 34.9 % — ABNORMAL LOW (ref 40.0–52.0)
HGB: 11.1 g/dL — ABNORMAL LOW (ref 13.0–18.0)
Lymphocyte %: 9.7 %
MCHC: 31.7 g/dL — ABNORMAL LOW (ref 32.0–36.0)
Monocyte #: 0.9 x10 3/mm (ref 0.2–1.0)
Neutrophil #: 6.4 x10 3/mm (ref 1.4–6.5)
RBC: 3.66 10*6/uL — ABNORMAL LOW (ref 4.40–5.90)
RDW: 16.4 % — ABNORMAL HIGH (ref 11.5–14.5)

## 2012-01-14 LAB — HEPATIC FUNCTION PANEL A (ARMC)
Albumin: 2.7 g/dL — ABNORMAL LOW (ref 3.4–5.0)
Alkaline Phosphatase: 82 U/L (ref 50–136)
Bilirubin, Direct: 0.1 mg/dL (ref 0.00–0.20)
Bilirubin,Total: 0.3 mg/dL (ref 0.2–1.0)
SGPT (ALT): 13 U/L (ref 12–78)

## 2012-01-14 LAB — GLUCOSE, RANDOM: Glucose: 84 mg/dL (ref 65–99)

## 2012-01-14 LAB — CREATININE, SERUM: EGFR (African American): 53 — ABNORMAL LOW

## 2012-01-14 LAB — MAGNESIUM: Magnesium: 1.5 mg/dL — ABNORMAL LOW

## 2012-01-15 ENCOUNTER — Ambulatory Visit: Payer: Self-pay | Admitting: Internal Medicine

## 2012-01-21 LAB — CBC CANCER CENTER
Basophil #: 0 x10 3/mm (ref 0.0–0.1)
Eosinophil %: 0.5 %
HGB: 11.1 g/dL — ABNORMAL LOW (ref 13.0–18.0)
Lymphocyte #: 0.7 x10 3/mm — ABNORMAL LOW (ref 1.0–3.6)
MCH: 30.2 pg (ref 26.0–34.0)
MCV: 96 fL (ref 80–100)
Monocyte #: 0.2 x10 3/mm (ref 0.2–1.0)
Monocyte %: 7.4 %
Neutrophil %: 65.3 %
Platelet: 182 x10 3/mm (ref 150–440)
RBC: 3.68 10*6/uL — ABNORMAL LOW (ref 4.40–5.90)
RDW: 16 % — ABNORMAL HIGH (ref 11.5–14.5)
WBC: 2.8 x10 3/mm — ABNORMAL LOW (ref 3.8–10.6)

## 2012-01-21 LAB — CREATININE, SERUM
EGFR (African American): 46 — ABNORMAL LOW
EGFR (Non-African Amer.): 40 — ABNORMAL LOW

## 2012-01-21 LAB — GLUCOSE, RANDOM: Glucose: 80 mg/dL (ref 65–99)

## 2012-01-21 LAB — HEPATIC FUNCTION PANEL A (ARMC)
Albumin: 2.9 g/dL — ABNORMAL LOW (ref 3.4–5.0)
Alkaline Phosphatase: 87 U/L (ref 50–136)
SGOT(AST): 18 U/L (ref 15–37)
Total Protein: 7.7 g/dL (ref 6.4–8.2)

## 2012-01-21 LAB — POTASSIUM: Potassium: 4.4 mmol/L (ref 3.5–5.1)

## 2012-01-21 LAB — MAGNESIUM: Magnesium: 1.8 mg/dL

## 2012-01-25 LAB — CREATININE, SERUM
Creatinine: 1.65 mg/dL — ABNORMAL HIGH (ref 0.60–1.30)
EGFR (African American): 45 — ABNORMAL LOW

## 2012-01-25 LAB — CBC CANCER CENTER
Basophil #: 0 x10 3/mm (ref 0.0–0.1)
Eosinophil #: 0 x10 3/mm (ref 0.0–0.7)
HCT: 33.2 % — ABNORMAL LOW (ref 40.0–52.0)
Lymphocyte #: 0.5 x10 3/mm — ABNORMAL LOW (ref 1.0–3.6)
MCH: 30.6 pg (ref 26.0–34.0)
MCHC: 31.9 g/dL — ABNORMAL LOW (ref 32.0–36.0)
MCV: 96 fL (ref 80–100)
Neutrophil #: 9.2 x10 3/mm — ABNORMAL HIGH (ref 1.4–6.5)
RDW: 15.1 % — ABNORMAL HIGH (ref 11.5–14.5)

## 2012-01-28 LAB — CREATININE, SERUM
Creatinine: 1.65 mg/dL — ABNORMAL HIGH (ref 0.60–1.30)
EGFR (Non-African Amer.): 39 — ABNORMAL LOW

## 2012-01-28 LAB — CBC CANCER CENTER
Basophil #: 0 x10 3/mm (ref 0.0–0.1)
Basophil %: 0.3 %
Eosinophil #: 0 x10 3/mm (ref 0.0–0.7)
HCT: 26.6 % — ABNORMAL LOW (ref 40.0–52.0)
HGB: 8.6 g/dL — ABNORMAL LOW (ref 13.0–18.0)
Lymphocyte #: 0.5 x10 3/mm — ABNORMAL LOW (ref 1.0–3.6)
Lymphocyte %: 18 %
MCH: 30.6 pg (ref 26.0–34.0)
MCHC: 32.3 g/dL (ref 32.0–36.0)
Monocyte #: 0.1 x10 3/mm — ABNORMAL LOW (ref 0.2–1.0)
Monocyte %: 3.3 %
Neutrophil %: 78.2 %
Platelet: 58 x10 3/mm — ABNORMAL LOW (ref 150–440)
RDW: 15.5 % — ABNORMAL HIGH (ref 11.5–14.5)
WBC: 2.7 x10 3/mm — ABNORMAL LOW (ref 3.8–10.6)

## 2012-01-28 LAB — BILIRUBIN, TOTAL: Bilirubin,Total: 0.2 mg/dL (ref 0.2–1.0)

## 2012-01-28 LAB — LACTATE DEHYDROGENASE: LDH: 108 U/L (ref 85–241)

## 2012-01-30 LAB — CBC CANCER CENTER
Basophil %: 0.3 %
Eosinophil %: 1.6 %
HCT: 27.6 % — ABNORMAL LOW (ref 40.0–52.0)
HGB: 8.9 g/dL — ABNORMAL LOW (ref 13.0–18.0)
Lymphocyte %: 17.2 %
MCH: 30.5 pg (ref 26.0–34.0)
Monocyte %: 6.7 %
Neutrophil #: 2.3 x10 3/mm (ref 1.4–6.5)
RBC: 2.91 10*6/uL — ABNORMAL LOW (ref 4.40–5.90)
RDW: 15 % — ABNORMAL HIGH (ref 11.5–14.5)
WBC: 3.2 x10 3/mm — ABNORMAL LOW (ref 3.8–10.6)

## 2012-02-04 LAB — COMPREHENSIVE METABOLIC PANEL
Albumin: 2.8 g/dL — ABNORMAL LOW (ref 3.4–5.0)
Alkaline Phosphatase: 88 U/L (ref 50–136)
Anion Gap: 9 (ref 7–16)
BUN: 20 mg/dL — ABNORMAL HIGH (ref 7–18)
Bilirubin,Total: 0.2 mg/dL (ref 0.2–1.0)
Chloride: 102 mmol/L (ref 98–107)
Creatinine: 1.68 mg/dL — ABNORMAL HIGH (ref 0.60–1.30)
EGFR (African American): 44 — ABNORMAL LOW
EGFR (Non-African Amer.): 38 — ABNORMAL LOW
Glucose: 79 mg/dL (ref 65–99)
Osmolality: 279 (ref 275–301)
Potassium: 4.1 mmol/L (ref 3.5–5.1)
SGOT(AST): 17 U/L (ref 15–37)
SGPT (ALT): 16 U/L (ref 12–78)
Total Protein: 7.5 g/dL (ref 6.4–8.2)

## 2012-02-04 LAB — MAGNESIUM: Magnesium: 1.8 mg/dL

## 2012-02-04 LAB — CBC CANCER CENTER
Basophil #: 0 x10 3/mm (ref 0.0–0.1)
Basophil %: 0.5 %
Eosinophil #: 0 x10 3/mm (ref 0.0–0.7)
HCT: 32.8 % — ABNORMAL LOW (ref 40.0–52.0)
HGB: 10.3 g/dL — ABNORMAL LOW (ref 13.0–18.0)
Lymphocyte %: 10.4 %
MCHC: 31.4 g/dL — ABNORMAL LOW (ref 32.0–36.0)
Monocyte #: 0.8 x10 3/mm (ref 0.2–1.0)
Neutrophil %: 74.1 %
Platelet: 390 x10 3/mm (ref 150–440)
RDW: 16.5 % — ABNORMAL HIGH (ref 11.5–14.5)
WBC: 5.5 x10 3/mm (ref 3.8–10.6)

## 2012-02-11 LAB — CREATININE, SERUM: Creatinine: 1.67 mg/dL — ABNORMAL HIGH (ref 0.60–1.30)

## 2012-02-11 LAB — CBC CANCER CENTER
Basophil #: 0 x10 3/mm (ref 0.0–0.1)
Basophil %: 0.6 %
Eosinophil #: 0 x10 3/mm (ref 0.0–0.7)
Eosinophil %: 0.1 %
HCT: 28.1 % — ABNORMAL LOW (ref 40.0–52.0)
HGB: 9.1 g/dL — ABNORMAL LOW (ref 13.0–18.0)
Lymphocyte #: 0.6 x10 3/mm — ABNORMAL LOW (ref 1.0–3.6)
Lymphocyte %: 16.8 %
MCH: 30.8 pg (ref 26.0–34.0)
MCHC: 32.3 g/dL (ref 32.0–36.0)
MCV: 95 fL (ref 80–100)
Monocyte #: 0.4 x10 3/mm (ref 0.2–1.0)
Neutrophil %: 69.8 %
RBC: 2.95 10*6/uL — ABNORMAL LOW (ref 4.40–5.90)
RDW: 16.1 % — ABNORMAL HIGH (ref 11.5–14.5)

## 2012-02-11 LAB — HEPATIC FUNCTION PANEL A (ARMC)
Alkaline Phosphatase: 70 U/L (ref 50–136)
Bilirubin,Total: 0.3 mg/dL (ref 0.2–1.0)
SGOT(AST): 16 U/L (ref 15–37)
SGPT (ALT): 15 U/L (ref 12–78)

## 2012-02-15 ENCOUNTER — Ambulatory Visit: Payer: Self-pay | Admitting: Internal Medicine

## 2012-02-18 LAB — CBC CANCER CENTER
Basophil %: 0.2 %
Eosinophil #: 0 x10 3/mm (ref 0.0–0.7)
Eosinophil %: 0.1 %
HCT: 30.3 % — ABNORMAL LOW (ref 40.0–52.0)
Lymphocyte #: 0.5 x10 3/mm — ABNORMAL LOW (ref 1.0–3.6)
MCH: 30.4 pg (ref 26.0–34.0)
MCHC: 31.8 g/dL — ABNORMAL LOW (ref 32.0–36.0)
MCV: 96 fL (ref 80–100)
Monocyte #: 0.5 x10 3/mm (ref 0.2–1.0)
Neutrophil #: 6.7 x10 3/mm — ABNORMAL HIGH (ref 1.4–6.5)
Neutrophil %: 85.8 %
Platelet: 99 x10 3/mm — ABNORMAL LOW (ref 150–440)
RDW: 16 % — ABNORMAL HIGH (ref 11.5–14.5)
WBC: 7.8 x10 3/mm (ref 3.8–10.6)

## 2012-02-21 LAB — CBC CANCER CENTER
Basophil #: 0 "x10 3/mm "
Basophil %: 0.3 %
Eosinophil #: 0 "x10 3/mm "
Eosinophil %: 0.4 %
HCT: 28.2 % — ABNORMAL LOW
HGB: 9 g/dL — ABNORMAL LOW
Lymphocyte %: 11.3 %
Lymphs Abs: 0.9 "x10 3/mm " — ABNORMAL LOW
MCH: 30.8 pg
MCHC: 31.9 g/dL — ABNORMAL LOW
MCV: 97 fL
Monocyte #: 1.2 "x10 3/mm " — ABNORMAL HIGH
Monocyte %: 14.8 %
Neutrophil #: 6 "x10 3/mm "
Neutrophil %: 73.2 %
Platelet: 168 "x10 3/mm "
RBC: 2.92 "x10 6/mm " — ABNORMAL LOW
RDW: 16.2 % — ABNORMAL HIGH
WBC: 8.2 "x10 3/mm "

## 2012-02-25 LAB — CBC CANCER CENTER
Basophil #: 0.1 x10 3/mm (ref 0.0–0.1)
Eosinophil %: 0.7 %
Lymphocyte %: 7.6 %
MCH: 31 pg (ref 26.0–34.0)
MCHC: 32 g/dL (ref 32.0–36.0)
Monocyte %: 11.7 %
Neutrophil #: 8.2 x10 3/mm — ABNORMAL HIGH (ref 1.4–6.5)
Neutrophil %: 79.4 %
Platelet: 422 x10 3/mm (ref 150–440)
RBC: 3.03 10*6/uL — ABNORMAL LOW (ref 4.40–5.90)
RDW: 17.4 % — ABNORMAL HIGH (ref 11.5–14.5)
WBC: 10.3 x10 3/mm (ref 3.8–10.6)

## 2012-02-25 LAB — COMPREHENSIVE METABOLIC PANEL
Albumin: 2.5 g/dL — ABNORMAL LOW (ref 3.4–5.0)
Alkaline Phosphatase: 77 U/L (ref 50–136)
BUN: 26 mg/dL — ABNORMAL HIGH (ref 7–18)
Calcium, Total: 8.5 mg/dL (ref 8.5–10.1)
Co2: 29 mmol/L (ref 21–32)
EGFR (Non-African Amer.): 33 — ABNORMAL LOW
Glucose: 87 mg/dL (ref 65–99)
SGOT(AST): 14 U/L — ABNORMAL LOW (ref 15–37)
SGPT (ALT): 14 U/L (ref 12–78)

## 2012-02-27 LAB — CREATININE, SERUM
Creatinine: 1.72 mg/dL — ABNORMAL HIGH (ref 0.60–1.30)
EGFR (African American): 43 — ABNORMAL LOW

## 2012-03-05 LAB — CBC CANCER CENTER
Basophil #: 0 x10 3/mm (ref 0.0–0.1)
Basophil %: 1.3 %
Eosinophil %: 0.8 %
HGB: 8.6 g/dL — ABNORMAL LOW (ref 13.0–18.0)
Lymphocyte #: 0.6 x10 3/mm — ABNORMAL LOW (ref 1.0–3.6)
Lymphocyte %: 30.2 %
MCH: 30.8 pg (ref 26.0–34.0)
MCV: 96 fL (ref 80–100)
Monocyte #: 0.4 x10 3/mm (ref 0.2–1.0)
Monocyte %: 18.4 %
Neutrophil %: 49.3 %
Platelet: 288 x10 3/mm (ref 150–440)
RBC: 2.8 10*6/uL — ABNORMAL LOW (ref 4.40–5.90)

## 2012-03-07 LAB — WBC: WBC: 8.4 10*3/uL (ref 3.8–10.6)

## 2012-03-10 LAB — CBC CANCER CENTER
Basophil #: 0.1 x10 3/mm (ref 0.0–0.1)
Eosinophil #: 0.1 x10 3/mm (ref 0.0–0.7)
Eosinophil %: 0.7 %
HCT: 29.9 % — ABNORMAL LOW (ref 40.0–52.0)
HGB: 9.5 g/dL — ABNORMAL LOW (ref 13.0–18.0)
Lymphocyte #: 0.8 x10 3/mm — ABNORMAL LOW (ref 1.0–3.6)
Lymphocyte %: 10.1 %
MCH: 30.6 pg (ref 26.0–34.0)
MCHC: 31.6 g/dL — ABNORMAL LOW (ref 32.0–36.0)
MCV: 97 fL (ref 80–100)
Monocyte %: 13.2 %
Platelet: 249 x10 3/mm (ref 150–440)
RBC: 3.09 10*6/uL — ABNORMAL LOW (ref 4.40–5.90)
RDW: 17.9 % — ABNORMAL HIGH (ref 11.5–14.5)

## 2012-03-16 ENCOUNTER — Ambulatory Visit: Payer: Self-pay | Admitting: Internal Medicine

## 2012-03-20 LAB — CBC CANCER CENTER
Basophil %: 1.2 %
Eosinophil %: 0.3 %
HCT: 30.5 % — ABNORMAL LOW (ref 40.0–52.0)
HGB: 10.1 g/dL — ABNORMAL LOW (ref 13.0–18.0)
MCH: 31.4 pg (ref 26.0–34.0)
MCHC: 33.1 g/dL (ref 32.0–36.0)
Monocyte #: 1 x10 3/mm (ref 0.2–1.0)
Monocyte %: 12.6 %
Neutrophil #: 6.5 x10 3/mm (ref 1.4–6.5)
Neutrophil %: 78.7 %
RBC: 3.21 10*6/uL — ABNORMAL LOW (ref 4.40–5.90)
WBC: 8.2 x10 3/mm (ref 3.8–10.6)

## 2012-03-20 LAB — COMPREHENSIVE METABOLIC PANEL
Albumin: 3.1 g/dL — ABNORMAL LOW (ref 3.4–5.0)
Alkaline Phosphatase: 81 U/L (ref 50–136)
Anion Gap: 2 — ABNORMAL LOW (ref 7–16)
BUN: 23 mg/dL — ABNORMAL HIGH (ref 7–18)
Bilirubin,Total: 0.3 mg/dL (ref 0.2–1.0)
Calcium, Total: 8.9 mg/dL (ref 8.5–10.1)
Co2: 33 mmol/L — ABNORMAL HIGH (ref 21–32)
Creatinine: 1.66 mg/dL — ABNORMAL HIGH (ref 0.60–1.30)
EGFR (African American): 45 — ABNORMAL LOW
EGFR (Non-African Amer.): 39 — ABNORMAL LOW
Osmolality: 277 (ref 275–301)
Potassium: 3.7 mmol/L (ref 3.5–5.1)
SGOT(AST): 17 U/L (ref 15–37)
SGPT (ALT): 12 U/L (ref 12–78)
Sodium: 135 mmol/L — ABNORMAL LOW (ref 136–145)

## 2012-03-27 LAB — CBC CANCER CENTER
Eosinophil %: 0.9 %
HCT: 26.5 % — ABNORMAL LOW (ref 40.0–52.0)
Lymphocyte #: 0.7 x10 3/mm — ABNORMAL LOW (ref 1.0–3.6)
Lymphocyte %: 20.3 %
MCH: 30.7 pg (ref 26.0–34.0)
MCV: 95 fL (ref 80–100)
Monocyte %: 11.8 %
Neutrophil #: 2.3 x10 3/mm (ref 1.4–6.5)
Platelet: 254 x10 3/mm (ref 150–440)
RBC: 2.8 10*6/uL — ABNORMAL LOW (ref 4.40–5.90)

## 2012-03-31 ENCOUNTER — Ambulatory Visit: Payer: Self-pay | Admitting: Internal Medicine

## 2012-03-31 LAB — CBC CANCER CENTER
Basophil #: 0 x10 3/mm (ref 0.0–0.1)
Eosinophil %: 0.5 %
HCT: 26 % — ABNORMAL LOW (ref 40.0–52.0)
HGB: 8.6 g/dL — ABNORMAL LOW (ref 13.0–18.0)
Lymphocyte %: 4.7 %
MCH: 31.4 pg (ref 26.0–34.0)
Monocyte %: 1 %
Neutrophil %: 93.5 %
Platelet: 171 x10 3/mm (ref 150–440)
RBC: 2.74 10*6/uL — ABNORMAL LOW (ref 4.40–5.90)
RDW: 17.2 % — ABNORMAL HIGH (ref 11.5–14.5)

## 2012-04-03 LAB — CBC CANCER CENTER
Eosinophil #: 0 x10 3/mm (ref 0.0–0.7)
Eosinophil %: 0.8 %
HGB: 8.6 g/dL — ABNORMAL LOW (ref 13.0–18.0)
Lymphocyte %: 27 %
MCH: 31.7 pg (ref 26.0–34.0)
MCHC: 33.3 g/dL (ref 32.0–36.0)
Monocyte #: 0.2 x10 3/mm (ref 0.2–1.0)
Neutrophil %: 62.5 %
Platelet: 108 x10 3/mm — ABNORMAL LOW (ref 150–440)
RBC: 2.7 10*6/uL — ABNORMAL LOW (ref 4.40–5.90)

## 2012-04-03 LAB — CREATININE, SERUM
Creatinine: 2.03 mg/dL — ABNORMAL HIGH (ref 0.60–1.30)
EGFR (Non-African Amer.): 30 — ABNORMAL LOW

## 2012-04-16 ENCOUNTER — Ambulatory Visit: Payer: Self-pay | Admitting: Internal Medicine

## 2012-04-25 ENCOUNTER — Inpatient Hospital Stay: Payer: Self-pay | Admitting: Internal Medicine

## 2012-04-25 LAB — CBC CANCER CENTER
Eosinophil #: 0 x10 3/mm (ref 0.0–0.7)
Eosinophil %: 0.4 %
HCT: 30.6 % — ABNORMAL LOW (ref 40.0–52.0)
HGB: 9.7 g/dL — ABNORMAL LOW (ref 13.0–18.0)
Lymphocyte #: 0.9 x10 3/mm — ABNORMAL LOW (ref 1.0–3.6)
Lymphocyte %: 7.5 %
MCH: 30.2 pg (ref 26.0–34.0)
Monocyte %: 15.2 %
Neutrophil #: 9.1 x10 3/mm — ABNORMAL HIGH (ref 1.4–6.5)
Neutrophil %: 76.3 %
Platelet: 443 x10 3/mm — ABNORMAL HIGH (ref 150–440)
RDW: 16.5 % — ABNORMAL HIGH (ref 11.5–14.5)
WBC: 10.2 x10 3/mm (ref 3.8–10.6)

## 2012-04-25 LAB — BASIC METABOLIC PANEL
BUN: 59 mg/dL — ABNORMAL HIGH (ref 7–18)
BUN: 57 mg/dL — ABNORMAL HIGH (ref 7–18)
Calcium, Total: 8.8 mg/dL (ref 8.5–10.1)
Chloride: 98 mmol/L (ref 98–107)
Co2: 25 mmol/L (ref 21–32)
EGFR (African American): 16 — ABNORMAL LOW
EGFR (Non-African Amer.): 13 — ABNORMAL LOW
Glucose: 82 mg/dL (ref 65–99)
Osmolality: 280 (ref 275–301)
Potassium: 5.1 mmol/L (ref 3.5–5.1)

## 2012-04-25 LAB — CREATININE, SERUM
Creatinine: 3.91 mg/dL — ABNORMAL HIGH (ref 0.60–1.30)
EGFR (African American): 16 — ABNORMAL LOW

## 2012-04-26 LAB — COMPREHENSIVE METABOLIC PANEL
Alkaline Phosphatase: 63 U/L (ref 50–136)
Bilirubin,Total: 0.3 mg/dL (ref 0.2–1.0)
Calcium, Total: 8 mg/dL — ABNORMAL LOW (ref 8.5–10.1)
Creatinine: 3.77 mg/dL — ABNORMAL HIGH (ref 0.60–1.30)
EGFR (African American): 16 — ABNORMAL LOW
EGFR (Non-African Amer.): 14 — ABNORMAL LOW
Osmolality: 283 (ref 275–301)
Potassium: 4.4 mmol/L (ref 3.5–5.1)
SGPT (ALT): 14 U/L (ref 12–78)
Sodium: 135 mmol/L — ABNORMAL LOW (ref 136–145)
Total Protein: 6.9 g/dL (ref 6.4–8.2)

## 2012-04-27 LAB — CBC WITH DIFFERENTIAL/PLATELET
Basophil #: 0.1 10*3/uL (ref 0.0–0.1)
Basophil %: 0.9 %
Eosinophil #: 0.1 10*3/uL (ref 0.0–0.7)
HCT: 24.1 % — ABNORMAL LOW (ref 40.0–52.0)
Lymphocyte #: 0.6 10*3/uL — ABNORMAL LOW (ref 1.0–3.6)
Lymphocyte %: 8.7 %
MCH: 30.7 pg (ref 26.0–34.0)
MCV: 94 fL (ref 80–100)
Monocyte #: 1.4 x10 3/mm — ABNORMAL HIGH (ref 0.2–1.0)
Monocyte %: 18.9 %
Neutrophil #: 5.1 10*3/uL (ref 1.4–6.5)
WBC: 7.4 10*3/uL (ref 3.8–10.6)

## 2012-04-27 LAB — BASIC METABOLIC PANEL
Anion Gap: 9 (ref 7–16)
BUN: 46 mg/dL — ABNORMAL HIGH (ref 7–18)
Calcium, Total: 8.2 mg/dL — ABNORMAL LOW (ref 8.5–10.1)
Co2: 25 mmol/L (ref 21–32)
Glucose: 70 mg/dL (ref 65–99)
Potassium: 3.7 mmol/L (ref 3.5–5.1)
Sodium: 138 mmol/L (ref 136–145)

## 2012-04-28 LAB — HEMOGLOBIN: HGB: 7.8 g/dL — ABNORMAL LOW (ref 13.0–18.0)

## 2012-04-28 LAB — CREATININE, SERUM
Creatinine: 2.24 mg/dL — ABNORMAL HIGH (ref 0.60–1.30)
EGFR (African American): 31 — ABNORMAL LOW
EGFR (Non-African Amer.): 27 — ABNORMAL LOW

## 2012-04-28 LAB — POTASSIUM: Potassium: 3.7 mmol/L (ref 3.5–5.1)

## 2012-04-29 LAB — CREATININE, SERUM
Creatinine: 1.81 mg/dL — ABNORMAL HIGH (ref 0.60–1.30)
EGFR (African American): 40 — ABNORMAL LOW
EGFR (Non-African Amer.): 35 — ABNORMAL LOW

## 2012-04-29 LAB — HEMOGLOBIN: HGB: 7.9 g/dL — ABNORMAL LOW (ref 13.0–18.0)

## 2012-05-02 LAB — CBC CANCER CENTER
Basophil #: 0.1 x10 3/mm (ref 0.0–0.1)
Basophil %: 0.5 %
Eosinophil #: 0.1 x10 3/mm (ref 0.0–0.7)
Eosinophil %: 0.7 %
Lymphocyte %: 6.6 %
MCH: 30.2 pg (ref 26.0–34.0)
MCHC: 32 g/dL (ref 32.0–36.0)
Monocyte #: 1.2 x10 3/mm — ABNORMAL HIGH (ref 0.2–1.0)
Neutrophil #: 9.4 x10 3/mm — ABNORMAL HIGH (ref 1.4–6.5)
Neutrophil %: 81.5 %
Platelet: 389 x10 3/mm (ref 150–440)
RDW: 15.8 % — ABNORMAL HIGH (ref 11.5–14.5)
WBC: 11.5 x10 3/mm — ABNORMAL HIGH (ref 3.8–10.6)

## 2012-05-02 LAB — COMPREHENSIVE METABOLIC PANEL WITH GFR
Albumin: 2.6 g/dL — ABNORMAL LOW
Alkaline Phosphatase: 65 U/L
Anion Gap: 9
BUN: 17 mg/dL
Bilirubin,Total: 0.2 mg/dL
Calcium, Total: 8.7 mg/dL
Chloride: 102 mmol/L
Co2: 28 mmol/L
Creatinine: 1.64 mg/dL — ABNORMAL HIGH
EGFR (African American): 45 — ABNORMAL LOW
EGFR (Non-African Amer.): 39 — ABNORMAL LOW
Glucose: 82 mg/dL
Osmolality: 278
Potassium: 4 mmol/L
SGOT(AST): 25 U/L
SGPT (ALT): 30 U/L
Sodium: 139 mmol/L
Total Protein: 7.8 g/dL

## 2012-05-09 LAB — CREATININE, SERUM
Creatinine: 1.46 mg/dL — ABNORMAL HIGH (ref 0.60–1.30)
EGFR (Non-African Amer.): 45 — ABNORMAL LOW

## 2012-05-09 LAB — CBC CANCER CENTER
Basophil #: 0 x10 3/mm (ref 0.0–0.1)
Basophil %: 0.6 %
Eosinophil #: 0.1 x10 3/mm (ref 0.0–0.7)
Eosinophil %: 1.4 %
HCT: 27.9 % — ABNORMAL LOW (ref 40.0–52.0)
Lymphocyte #: 0.5 x10 3/mm — ABNORMAL LOW (ref 1.0–3.6)
MCH: 31 pg (ref 26.0–34.0)
MCHC: 32.5 g/dL (ref 32.0–36.0)
MCV: 96 fL (ref 80–100)
Monocyte #: 0.8 x10 3/mm (ref 0.2–1.0)
Monocyte %: 10.7 %
Neutrophil #: 6.4 x10 3/mm (ref 1.4–6.5)
Neutrophil %: 81.4 %
RBC: 2.92 10*6/uL — ABNORMAL LOW (ref 4.40–5.90)
RDW: 15.5 % — ABNORMAL HIGH (ref 11.5–14.5)
WBC: 7.9 x10 3/mm (ref 3.8–10.6)

## 2012-05-14 LAB — CBC CANCER CENTER
Basophil #: 0 x10 3/mm (ref 0.0–0.1)
Eosinophil #: 0.3 x10 3/mm (ref 0.0–0.7)
HCT: 32.6 % — ABNORMAL LOW (ref 40.0–52.0)
MCH: 30.9 pg (ref 26.0–34.0)
MCHC: 32.3 g/dL (ref 32.0–36.0)
MCV: 96 fL (ref 80–100)
Monocyte %: 17.8 %
Neutrophil #: 3.7 x10 3/mm (ref 1.4–6.5)
Platelet: 439 x10 3/mm (ref 150–440)
RDW: 15.3 % — ABNORMAL HIGH (ref 11.5–14.5)
WBC: 5.9 x10 3/mm (ref 3.8–10.6)

## 2012-05-14 LAB — CREATININE, SERUM
Creatinine: 1.58 mg/dL — ABNORMAL HIGH (ref 0.60–1.30)
EGFR (African American): 47 — ABNORMAL LOW

## 2012-05-16 LAB — CBC CANCER CENTER
Basophil %: 0.1 %
Eosinophil #: 0 x10 3/mm (ref 0.0–0.7)
Eosinophil %: 0.1 %
HGB: 9.5 g/dL — ABNORMAL LOW (ref 13.0–18.0)
MCH: 30.5 pg (ref 26.0–34.0)
MCHC: 32.3 g/dL (ref 32.0–36.0)
MCV: 95 fL (ref 80–100)
Monocyte #: 0 x10 3/mm — ABNORMAL LOW (ref 0.2–1.0)
Monocyte %: 0.6 %
Neutrophil #: 3.7 x10 3/mm (ref 1.4–6.5)
Neutrophil %: 94.4 %
RBC: 3.12 10*6/uL — ABNORMAL LOW (ref 4.40–5.90)
WBC: 3.9 x10 3/mm (ref 3.8–10.6)

## 2012-05-16 LAB — CREATININE, SERUM
Creatinine: 1.58 mg/dL — ABNORMAL HIGH (ref 0.60–1.30)
EGFR (African American): 47 — ABNORMAL LOW
EGFR (Non-African Amer.): 41 — ABNORMAL LOW

## 2012-05-17 ENCOUNTER — Ambulatory Visit: Payer: Self-pay | Admitting: Internal Medicine

## 2012-05-23 LAB — CBC CANCER CENTER
Basophil #: 0 x10 3/mm (ref 0.0–0.1)
Basophil %: 0.7 %
Eosinophil #: 0.1 x10 3/mm (ref 0.0–0.7)
Eosinophil %: 1.1 %
Lymphocyte #: 0.6 x10 3/mm — ABNORMAL LOW (ref 1.0–3.6)
MCHC: 32.1 g/dL (ref 32.0–36.0)
MCV: 95 fL (ref 80–100)
Monocyte %: 5.1 %
Neutrophil #: 5.3 x10 3/mm (ref 1.4–6.5)
WBC: 6.4 x10 3/mm (ref 3.8–10.6)

## 2012-05-23 LAB — CREATININE, SERUM
Creatinine: 1.42 mg/dL — ABNORMAL HIGH (ref 0.60–1.30)
EGFR (Non-African Amer.): 46 — ABNORMAL LOW

## 2012-05-23 LAB — POTASSIUM: Potassium: 4.5 mmol/L (ref 3.5–5.1)

## 2012-05-23 LAB — HEPATIC FUNCTION PANEL A (ARMC)
Bilirubin, Direct: 0.1 mg/dL (ref 0.00–0.20)
SGPT (ALT): 19 U/L (ref 12–78)
Total Protein: 7.4 g/dL (ref 6.4–8.2)

## 2012-05-26 LAB — CBC CANCER CENTER
Basophil %: 0.1 %
Eosinophil %: 0 %
HCT: 30 % — ABNORMAL LOW (ref 40.0–52.0)
HGB: 9.6 g/dL — ABNORMAL LOW (ref 13.0–18.0)
Lymphocyte #: 0.2 x10 3/mm — ABNORMAL LOW (ref 1.0–3.6)
MCH: 30.6 pg (ref 26.0–34.0)
MCHC: 32 g/dL (ref 32.0–36.0)
Monocyte %: 2.2 %
Neutrophil #: 3.9 x10 3/mm (ref 1.4–6.5)
Neutrophil %: 93.3 %
Platelet: 364 x10 3/mm (ref 150–440)
RBC: 3.14 10*6/uL — ABNORMAL LOW (ref 4.40–5.90)
RDW: 15.1 % — ABNORMAL HIGH (ref 11.5–14.5)

## 2012-05-26 LAB — CREATININE, SERUM: EGFR (Non-African Amer.): 44 — ABNORMAL LOW

## 2012-06-02 LAB — CBC CANCER CENTER
Basophil %: 0.6 %
Eosinophil #: 0 x10 3/mm (ref 0.0–0.7)
Eosinophil %: 0 %
HCT: 29.7 % — ABNORMAL LOW (ref 40.0–52.0)
HGB: 9.6 g/dL — ABNORMAL LOW (ref 13.0–18.0)
Lymphocyte #: 0.3 x10 3/mm — ABNORMAL LOW (ref 1.0–3.6)
Lymphocyte %: 12.2 %
MCH: 30.4 pg (ref 26.0–34.0)
MCHC: 32.4 g/dL (ref 32.0–36.0)
Neutrophil #: 1.8 x10 3/mm (ref 1.4–6.5)
Neutrophil %: 80.5 %
Platelet: 294 x10 3/mm (ref 150–440)
RBC: 3.16 10*6/uL — ABNORMAL LOW (ref 4.40–5.90)
RDW: 15.3 % — ABNORMAL HIGH (ref 11.5–14.5)

## 2012-06-02 LAB — CREATININE, SERUM
Creatinine: 1.28 mg/dL (ref 0.60–1.30)
EGFR (African American): >60

## 2012-06-02 LAB — HEPATIC FUNCTION PANEL A (ARMC)
Alkaline Phosphatase: 66 U/L (ref 50–136)
Bilirubin,Total: 0.3 mg/dL (ref 0.2–1.0)
SGOT(AST): 16 U/L (ref 15–37)
Total Protein: 7.2 g/dL (ref 6.4–8.2)

## 2012-06-06 LAB — CBC CANCER CENTER
Basophil #: 0 x10 3/mm (ref 0.0–0.1)
Basophil %: 0.6 %
Eosinophil #: 0 x10 3/mm (ref 0.0–0.7)
Eosinophil %: 0.4 %
HCT: 30.6 % — ABNORMAL LOW (ref 40.0–52.0)
HGB: 9.9 g/dL — ABNORMAL LOW (ref 13.0–18.0)
Lymphocyte #: 0.6 x10 3/mm — ABNORMAL LOW (ref 1.0–3.6)
Lymphocyte %: 15.5 %
MCHC: 32.3 g/dL (ref 32.0–36.0)
MCV: 94 fL (ref 80–100)
Monocyte #: 0.3 x10 3/mm (ref 0.2–1.0)
Neutrophil #: 3 x10 3/mm (ref 1.4–6.5)
Neutrophil %: 76.7 %

## 2012-06-09 LAB — CBC CANCER CENTER
Basophil #: 0 x10 3/mm (ref 0.0–0.1)
HCT: 30.5 % — ABNORMAL LOW (ref 40.0–52.0)
HGB: 9.8 g/dL — ABNORMAL LOW (ref 13.0–18.0)
MCH: 30.3 pg (ref 26.0–34.0)
MCHC: 32.1 g/dL (ref 32.0–36.0)
MCV: 94 fL (ref 80–100)
Monocyte #: 0.4 x10 3/mm (ref 0.2–1.0)
Neutrophil %: 66.5 %
Platelet: 245 x10 3/mm (ref 150–440)
RBC: 3.23 10*6/uL — ABNORMAL LOW (ref 4.40–5.90)
RDW: 15.9 % — ABNORMAL HIGH (ref 11.5–14.5)

## 2012-06-14 ENCOUNTER — Ambulatory Visit: Payer: Self-pay | Admitting: Internal Medicine

## 2012-06-16 LAB — CBC CANCER CENTER
Basophil %: 0.2 %
Eosinophil %: 0 %
HCT: 31.4 % — ABNORMAL LOW (ref 40.0–52.0)
Lymphocyte #: 0.2 x10 3/mm — ABNORMAL LOW (ref 1.0–3.6)
Lymphocyte %: 8.5 %
MCH: 30 pg (ref 26.0–34.0)
MCHC: 32.1 g/dL (ref 32.0–36.0)
MCV: 94 fL (ref 80–100)
Monocyte #: 0.3 x10 3/mm (ref 0.2–1.0)
Neutrophil #: 2.2 x10 3/mm (ref 1.4–6.5)
Neutrophil %: 81.2 %
Platelet: 253 x10 3/mm (ref 150–440)
RDW: 16.1 % — ABNORMAL HIGH (ref 11.5–14.5)
WBC: 2.7 x10 3/mm — ABNORMAL LOW (ref 3.8–10.6)

## 2012-06-16 LAB — COMPREHENSIVE METABOLIC PANEL
Alkaline Phosphatase: 70 U/L (ref 50–136)
Anion Gap: 8 (ref 7–16)
BUN: 20 mg/dL — ABNORMAL HIGH (ref 7–18)
Bilirubin,Total: 0.4 mg/dL (ref 0.2–1.0)
Calcium, Total: 9 mg/dL (ref 8.5–10.1)
Chloride: 99 mmol/L (ref 98–107)
Co2: 33 mmol/L — ABNORMAL HIGH (ref 21–32)
Creatinine: 1.45 mg/dL — ABNORMAL HIGH (ref 0.60–1.30)
EGFR (African American): 52 — ABNORMAL LOW
EGFR (Non-African Amer.): 45 — ABNORMAL LOW
Osmolality: 283 (ref 275–301)
Potassium: 4.6 mmol/L (ref 3.5–5.1)
SGPT (ALT): 16 U/L (ref 12–78)
Total Protein: 7.4 g/dL (ref 6.4–8.2)

## 2012-06-23 LAB — CBC CANCER CENTER
Basophil #: 0 x10 3/mm (ref 0.0–0.1)
Basophil %: 0.7 %
Eosinophil #: 0 x10 3/mm (ref 0.0–0.7)
HCT: 30.1 % — ABNORMAL LOW (ref 40.0–52.0)
HGB: 9.8 g/dL — ABNORMAL LOW (ref 13.0–18.0)
Lymphocyte #: 0.6 x10 3/mm — ABNORMAL LOW (ref 1.0–3.6)
MCHC: 32.4 g/dL (ref 32.0–36.0)
MCV: 93 fL (ref 80–100)
Monocyte #: 0.5 x10 3/mm (ref 0.2–1.0)
Monocyte %: 9.6 %
Neutrophil #: 3.7 x10 3/mm (ref 1.4–6.5)
Neutrophil %: 77.4 %
Platelet: 236 x10 3/mm (ref 150–440)
RBC: 3.24 10*6/uL — ABNORMAL LOW (ref 4.40–5.90)
RDW: 16.4 % — ABNORMAL HIGH (ref 11.5–14.5)

## 2012-06-23 LAB — CREATININE, SERUM: EGFR (African American): 43 — ABNORMAL LOW

## 2012-06-26 LAB — CREATININE, SERUM
Creatinine: 1.68 mg/dL — ABNORMAL HIGH (ref 0.60–1.30)
EGFR (Non-African Amer.): 38 — ABNORMAL LOW

## 2012-06-26 LAB — CBC CANCER CENTER
Basophil #: 0 x10 3/mm (ref 0.0–0.1)
Basophil %: 0.2 %
Eosinophil #: 0 x10 3/mm (ref 0.0–0.7)
Eosinophil %: 0 %
HGB: 10.1 g/dL — ABNORMAL LOW (ref 13.0–18.0)
MCH: 30.5 pg (ref 26.0–34.0)
MCHC: 32.6 g/dL (ref 32.0–36.0)
Monocyte #: 0.2 x10 3/mm (ref 0.2–1.0)
Neutrophil %: 87.4 %
RBC: 3.32 10*6/uL — ABNORMAL LOW (ref 4.40–5.90)
RDW: 16.1 % — ABNORMAL HIGH (ref 11.5–14.5)

## 2012-07-03 LAB — CBC CANCER CENTER
Basophil %: 0.3 %
HCT: 29.4 % — ABNORMAL LOW (ref 40.0–52.0)
HGB: 9.6 g/dL — ABNORMAL LOW (ref 13.0–18.0)
Lymphocyte %: 7.4 %
MCH: 30.2 pg (ref 26.0–34.0)
MCHC: 32.6 g/dL (ref 32.0–36.0)
Monocyte #: 0.1 x10 3/mm — ABNORMAL LOW (ref 0.2–1.0)
Monocyte %: 4.3 %
Platelet: 272 x10 3/mm (ref 150–440)
RBC: 3.18 10*6/uL — ABNORMAL LOW (ref 4.40–5.90)
RDW: 16.4 % — ABNORMAL HIGH (ref 11.5–14.5)

## 2012-07-03 LAB — POTASSIUM: Potassium: 4.5 mmol/L (ref 3.5–5.1)

## 2012-07-03 LAB — CREATININE, SERUM: EGFR (African American): 47 — ABNORMAL LOW

## 2012-07-07 LAB — CBC CANCER CENTER
Basophil #: 0 x10 3/mm (ref 0.0–0.1)
Basophil %: 0.6 %
Eosinophil %: 0.4 %
HCT: 29.2 % — ABNORMAL LOW (ref 40.0–52.0)
HGB: 9.5 g/dL — ABNORMAL LOW (ref 13.0–18.0)
Lymphocyte #: 0.4 x10 3/mm — ABNORMAL LOW (ref 1.0–3.6)
Lymphocyte %: 12.1 %
MCH: 30.4 pg (ref 26.0–34.0)
MCV: 93 fL (ref 80–100)
Monocyte #: 0.3 x10 3/mm (ref 0.2–1.0)
Neutrophil #: 2.5 x10 3/mm (ref 1.4–6.5)
Neutrophil %: 78.7 %
Platelet: 280 x10 3/mm (ref 150–440)

## 2012-07-15 ENCOUNTER — Ambulatory Visit: Payer: Self-pay | Admitting: Internal Medicine

## 2012-07-17 LAB — COMPREHENSIVE METABOLIC PANEL
Alkaline Phosphatase: 62 U/L (ref 50–136)
Anion Gap: 9 (ref 7–16)
BUN: 29 mg/dL — ABNORMAL HIGH (ref 7–18)
Bilirubin,Total: 0.3 mg/dL (ref 0.2–1.0)
Chloride: 98 mmol/L (ref 98–107)
Co2: 29 mmol/L (ref 21–32)
Creatinine: 2.13 mg/dL — ABNORMAL HIGH (ref 0.60–1.30)
EGFR (Non-African Amer.): 28 — ABNORMAL LOW
Glucose: 118 mg/dL — ABNORMAL HIGH (ref 65–99)
Potassium: 4.6 mmol/L (ref 3.5–5.1)
SGOT(AST): 14 U/L — ABNORMAL LOW (ref 15–37)
SGPT (ALT): 16 U/L (ref 12–78)
Sodium: 136 mmol/L (ref 136–145)
Total Protein: 7.7 g/dL (ref 6.4–8.2)

## 2012-07-17 LAB — CBC CANCER CENTER
Basophil #: 0 x10 3/mm (ref 0.0–0.1)
Eosinophil #: 0 x10 3/mm (ref 0.0–0.7)
HCT: 30 % — ABNORMAL LOW (ref 40.0–52.0)
Lymphocyte #: 0.2 x10 3/mm — ABNORMAL LOW (ref 1.0–3.6)
Lymphocyte %: 5.6 %
Monocyte %: 2 %
Neutrophil %: 92.1 %
RDW: 17.4 % — ABNORMAL HIGH (ref 11.5–14.5)
WBC: 3.6 x10 3/mm — ABNORMAL LOW (ref 3.8–10.6)

## 2012-07-24 LAB — BASIC METABOLIC PANEL
Anion Gap: 7 (ref 7–16)
Chloride: 98 mmol/L (ref 98–107)
Co2: 31 mmol/L (ref 21–32)
EGFR (African American): 32 — ABNORMAL LOW
Glucose: 87 mg/dL (ref 65–99)
Osmolality: 280 (ref 275–301)
Potassium: 3.8 mmol/L (ref 3.5–5.1)
Sodium: 136 mmol/L (ref 136–145)

## 2012-07-31 LAB — CBC CANCER CENTER
Basophil #: 0 x10 3/mm (ref 0.0–0.1)
HGB: 9.8 g/dL — ABNORMAL LOW (ref 13.0–18.0)
Lymphocyte #: 0.5 x10 3/mm — ABNORMAL LOW (ref 1.0–3.6)
MCHC: 32.3 g/dL (ref 32.0–36.0)
MCV: 92 fL (ref 80–100)
Monocyte #: 0.8 x10 3/mm (ref 0.2–1.0)
Neutrophil #: 4.5 x10 3/mm (ref 1.4–6.5)
Neutrophil %: 77.1 %
RBC: 3.3 10*6/uL — ABNORMAL LOW (ref 4.40–5.90)
RDW: 18.2 % — ABNORMAL HIGH (ref 11.5–14.5)
WBC: 5.8 x10 3/mm (ref 3.8–10.6)

## 2012-07-31 LAB — BASIC METABOLIC PANEL
Anion Gap: 9 (ref 7–16)
Calcium, Total: 9.5 mg/dL (ref 8.5–10.1)
Co2: 27 mmol/L (ref 21–32)
Creatinine: 2.47 mg/dL — ABNORMAL HIGH (ref 0.60–1.30)
EGFR (Non-African Amer.): 24 — ABNORMAL LOW
Osmolality: 280 (ref 275–301)
Potassium: 4.3 mmol/L (ref 3.5–5.1)

## 2012-08-06 ENCOUNTER — Ambulatory Visit: Payer: Self-pay | Admitting: Urology

## 2012-08-11 ENCOUNTER — Ambulatory Visit: Payer: Self-pay | Admitting: Urology

## 2012-08-14 ENCOUNTER — Ambulatory Visit: Payer: Self-pay | Admitting: Internal Medicine

## 2012-08-21 LAB — BASIC METABOLIC PANEL
Anion Gap: 7 (ref 7–16)
BUN: 22 mg/dL — ABNORMAL HIGH (ref 7–18)
Calcium, Total: 8.7 mg/dL (ref 8.5–10.1)
Chloride: 98 mmol/L (ref 98–107)
EGFR (African American): 47 — ABNORMAL LOW
EGFR (Non-African Amer.): 40 — ABNORMAL LOW
Osmolality: 276 (ref 275–301)
Potassium: 4.1 mmol/L (ref 3.5–5.1)

## 2012-08-21 LAB — CBC CANCER CENTER
Basophil #: 0.1 x10 3/mm (ref 0.0–0.1)
Basophil %: 0.8 %
Eosinophil %: 0.7 %
Lymphocyte #: 0.7 x10 3/mm — ABNORMAL LOW (ref 1.0–3.6)
MCH: 29.5 pg (ref 26.0–34.0)
MCV: 92 fL (ref 80–100)
Monocyte %: 10.3 %
Neutrophil #: 5.5 x10 3/mm (ref 1.4–6.5)
Neutrophil %: 77.9 %
Platelet: 342 x10 3/mm (ref 150–440)
RBC: 3.25 10*6/uL — ABNORMAL LOW (ref 4.40–5.90)
WBC: 7 x10 3/mm (ref 3.8–10.6)

## 2012-08-22 LAB — CREATININE, SERUM
Creatinine: 1.53 mg/dL — ABNORMAL HIGH (ref 0.60–1.30)
EGFR (African American): 49 — ABNORMAL LOW

## 2012-09-01 LAB — COMPREHENSIVE METABOLIC PANEL
Alkaline Phosphatase: 82 U/L (ref 50–136)
Anion Gap: 7 (ref 7–16)
BUN: 22 mg/dL — ABNORMAL HIGH (ref 7–18)
Bilirubin,Total: 0.2 mg/dL (ref 0.2–1.0)
Calcium, Total: 8.8 mg/dL (ref 8.5–10.1)
Chloride: 97 mmol/L — ABNORMAL LOW (ref 98–107)
Co2: 33 mmol/L — ABNORMAL HIGH (ref 21–32)
EGFR (African American): 49 — ABNORMAL LOW
EGFR (Non-African Amer.): 42 — ABNORMAL LOW
Glucose: 136 mg/dL — ABNORMAL HIGH (ref 65–99)
Osmolality: 279 (ref 275–301)
Potassium: 4.1 mmol/L (ref 3.5–5.1)
SGOT(AST): 14 U/L — ABNORMAL LOW (ref 15–37)
Sodium: 137 mmol/L (ref 136–145)

## 2012-09-01 LAB — CBC CANCER CENTER
Eosinophil %: 0 %
MCHC: 32.4 g/dL (ref 32.0–36.0)
MCV: 91 fL (ref 80–100)
Neutrophil #: 3.7 x10 3/mm (ref 1.4–6.5)
WBC: 4.1 x10 3/mm (ref 3.8–10.6)

## 2012-09-01 LAB — MAGNESIUM: Magnesium: 1.6 mg/dL — ABNORMAL LOW

## 2012-09-11 LAB — CBC CANCER CENTER
Basophil #: 0 x10 3/mm (ref 0.0–0.1)
Eosinophil #: 0 x10 3/mm (ref 0.0–0.7)
Eosinophil %: 0 %
HCT: 25.7 % — ABNORMAL LOW (ref 40.0–52.0)
HGB: 8.3 g/dL — ABNORMAL LOW (ref 13.0–18.0)
Lymphocyte #: 0.5 x10 3/mm — ABNORMAL LOW (ref 1.0–3.6)
MCH: 29.4 pg (ref 26.0–34.0)
MCV: 91 fL (ref 80–100)
Monocyte #: 0.4 x10 3/mm (ref 0.2–1.0)
Monocyte %: 12.6 %
Neutrophil #: 2.4 x10 3/mm (ref 1.4–6.5)
Neutrophil %: 71.4 %
RBC: 2.83 10*6/uL — ABNORMAL LOW (ref 4.40–5.90)
RDW: 17.5 % — ABNORMAL HIGH (ref 11.5–14.5)

## 2012-09-11 LAB — MAGNESIUM: Magnesium: 1.7 mg/dL — ABNORMAL LOW

## 2012-09-11 LAB — POTASSIUM: Potassium: 4.2 mmol/L (ref 3.5–5.1)

## 2012-09-11 LAB — CREATININE, SERUM: EGFR (Non-African Amer.): 37 — ABNORMAL LOW

## 2012-09-14 ENCOUNTER — Ambulatory Visit: Payer: Self-pay | Admitting: Internal Medicine

## 2012-09-18 LAB — CBC CANCER CENTER
Basophil #: 0 x10 3/mm (ref 0.0–0.1)
Basophil %: 0.4 %
Eosinophil %: 0 %
HGB: 9 g/dL — ABNORMAL LOW (ref 13.0–18.0)
Lymphocyte #: 0.6 x10 3/mm — ABNORMAL LOW (ref 1.0–3.6)
Lymphocyte %: 13.1 %
MCH: 29.8 pg (ref 26.0–34.0)
MCV: 91 fL (ref 80–100)
Neutrophil #: 3 x10 3/mm (ref 1.4–6.5)
RDW: 17.2 % — ABNORMAL HIGH (ref 11.5–14.5)

## 2012-09-18 LAB — BASIC METABOLIC PANEL
Anion Gap: 9 (ref 7–16)
BUN: 42 mg/dL — ABNORMAL HIGH (ref 7–18)
Chloride: 97 mmol/L — ABNORMAL LOW (ref 98–107)
Co2: 30 mmol/L (ref 21–32)
EGFR (African American): 30 — ABNORMAL LOW
EGFR (Non-African Amer.): 26 — ABNORMAL LOW
Glucose: 99 mg/dL (ref 65–99)
Osmolality: 282 (ref 275–301)
Potassium: 4.7 mmol/L (ref 3.5–5.1)
Sodium: 136 mmol/L (ref 136–145)

## 2012-09-18 LAB — MAGNESIUM: Magnesium: 1.6 mg/dL — ABNORMAL LOW

## 2012-09-29 LAB — CREATININE, SERUM
Creatinine: 2.42 mg/dL — ABNORMAL HIGH (ref 0.60–1.30)
EGFR (African American): 28 — ABNORMAL LOW
EGFR (Non-African Amer.): 24 — ABNORMAL LOW

## 2012-09-29 LAB — BASIC METABOLIC PANEL
BUN: 37 mg/dL — ABNORMAL HIGH (ref 7–18)
Chloride: 96 mmol/L — ABNORMAL LOW (ref 98–107)
Creatinine: 2.47 mg/dL — ABNORMAL HIGH (ref 0.60–1.30)

## 2012-09-29 LAB — CANCER CENTER HEMOGLOBIN: HGB: 9.5 g/dL — ABNORMAL LOW (ref 13.0–18.0)

## 2012-10-09 LAB — BASIC METABOLIC PANEL
Anion Gap: 9 (ref 7–16)
BUN: 34 mg/dL — ABNORMAL HIGH (ref 7–18)
Calcium, Total: 9.4 mg/dL (ref 8.5–10.1)
Chloride: 96 mmol/L — ABNORMAL LOW (ref 98–107)
Co2: 28 mmol/L (ref 21–32)
Creatinine: 2.42 mg/dL — ABNORMAL HIGH (ref 0.60–1.30)
EGFR (African American): 28 — ABNORMAL LOW
Osmolality: 277 (ref 275–301)
Sodium: 133 mmol/L — ABNORMAL LOW (ref 136–145)

## 2012-10-09 LAB — CBC CANCER CENTER
Basophil %: 0.8 %
HCT: 28.7 % — ABNORMAL LOW (ref 40.0–52.0)
Neutrophil #: 5.8 x10 3/mm (ref 1.4–6.5)
Platelet: 410 x10 3/mm (ref 150–440)
RBC: 3.13 10*6/uL — ABNORMAL LOW (ref 4.40–5.90)

## 2012-10-14 ENCOUNTER — Ambulatory Visit: Payer: Self-pay | Admitting: Internal Medicine

## 2012-10-20 LAB — BASIC METABOLIC PANEL
Anion Gap: 8 (ref 7–16)
Calcium, Total: 9.2 mg/dL (ref 8.5–10.1)
Chloride: 95 mmol/L — ABNORMAL LOW (ref 98–107)
Co2: 27 mmol/L (ref 21–32)
EGFR (African American): 20 — ABNORMAL LOW
Potassium: 5.3 mmol/L — ABNORMAL HIGH (ref 3.5–5.1)

## 2012-10-20 LAB — CANCER CENTER HEMOGLOBIN: HGB: 9.5 g/dL — ABNORMAL LOW (ref 13.0–18.0)

## 2012-10-29 ENCOUNTER — Inpatient Hospital Stay: Payer: Self-pay | Admitting: Internal Medicine

## 2012-10-29 LAB — CBC CANCER CENTER
Basophil %: 0.2 %
Eosinophil %: 0 %
HCT: 33 % — ABNORMAL LOW (ref 40.0–52.0)
HGB: 10.8 g/dL — ABNORMAL LOW (ref 13.0–18.0)
Lymphocyte #: 0.2 x10 3/mm — ABNORMAL LOW (ref 1.0–3.6)
MCHC: 32.7 g/dL (ref 32.0–36.0)
Monocyte %: 5.9 %
Neutrophil #: 9.7 x10 3/mm — ABNORMAL HIGH (ref 1.4–6.5)
Platelet: 504 x10 3/mm — ABNORMAL HIGH (ref 150–440)
RDW: 16.1 % — ABNORMAL HIGH (ref 11.5–14.5)
WBC: 10.6 x10 3/mm (ref 3.8–10.6)

## 2012-10-29 LAB — BASIC METABOLIC PANEL
Anion Gap: 15 (ref 7–16)
BUN: 83 mg/dL — ABNORMAL HIGH (ref 7–18)
EGFR (African American): 12 — ABNORMAL LOW
EGFR (Non-African Amer.): 11 — ABNORMAL LOW
Glucose: 84 mg/dL (ref 65–99)
Osmolality: 283 (ref 275–301)
Sodium: 129 mmol/L — ABNORMAL LOW (ref 136–145)

## 2012-10-29 LAB — POTASSIUM: Potassium: 4.7 mmol/L (ref 3.5–5.1)

## 2012-10-29 LAB — CREATININE, SERUM
Creatinine: 4.86 mg/dL — ABNORMAL HIGH (ref 0.60–1.30)
EGFR (Non-African Amer.): 10 — ABNORMAL LOW

## 2012-10-29 LAB — CANCER CENTER HEMOGLOBIN: HGB: 11 g/dL — ABNORMAL LOW (ref 13.0–18.0)

## 2012-10-29 LAB — MAGNESIUM: Magnesium: 1.9 mg/dL

## 2012-10-29 LAB — URIC ACID: Uric Acid: 6.9 mg/dL (ref 3.5–7.2)

## 2012-10-30 LAB — BASIC METABOLIC PANEL
BUN: 80 mg/dL — ABNORMAL HIGH (ref 7–18)
Calcium, Total: 8.4 mg/dL — ABNORMAL LOW (ref 8.5–10.1)
Co2: 24 mmol/L (ref 21–32)
EGFR (Non-African Amer.): 11 — ABNORMAL LOW
Potassium: 4.3 mmol/L (ref 3.5–5.1)
Sodium: 131 mmol/L — ABNORMAL LOW (ref 136–145)

## 2012-10-30 LAB — HEMOGLOBIN: HGB: 7.9 g/dL — ABNORMAL LOW (ref 13.0–18.0)

## 2012-10-31 LAB — BASIC METABOLIC PANEL
BUN: 77 mg/dL — ABNORMAL HIGH (ref 7–18)
Calcium, Total: 8.6 mg/dL (ref 8.5–10.1)
Chloride: 103 mmol/L (ref 98–107)
Co2: 24 mmol/L (ref 21–32)
EGFR (Non-African Amer.): 14 — ABNORMAL LOW
Potassium: 3.9 mmol/L (ref 3.5–5.1)

## 2012-11-01 LAB — BASIC METABOLIC PANEL
Anion Gap: 8 (ref 7–16)
BUN: 82 mg/dL — ABNORMAL HIGH (ref 7–18)
Calcium, Total: 8.4 mg/dL — ABNORMAL LOW (ref 8.5–10.1)
Chloride: 102 mmol/L (ref 98–107)
Co2: 21 mmol/L (ref 21–32)
Creatinine: 3.65 mg/dL — ABNORMAL HIGH (ref 0.60–1.30)
Glucose: 67 mg/dL (ref 65–99)
Sodium: 131 mmol/L — ABNORMAL LOW (ref 136–145)

## 2012-11-01 LAB — HEMOGLOBIN: HGB: 8.4 g/dL — ABNORMAL LOW (ref 13.0–18.0)

## 2012-11-02 LAB — BASIC METABOLIC PANEL
Anion Gap: 6 — ABNORMAL LOW (ref 7–16)
BUN: 81 mg/dL — ABNORMAL HIGH (ref 7–18)
Creatinine: 3.55 mg/dL — ABNORMAL HIGH (ref 0.60–1.30)
EGFR (Non-African Amer.): 15 — ABNORMAL LOW
Potassium: 4.6 mmol/L (ref 3.5–5.1)
Sodium: 132 mmol/L — ABNORMAL LOW (ref 136–145)

## 2012-11-03 LAB — BASIC METABOLIC PANEL
Anion Gap: 6 — ABNORMAL LOW (ref 7–16)
Calcium, Total: 8.6 mg/dL (ref 8.5–10.1)
Co2: 22 mmol/L (ref 21–32)
Creatinine: 3.4 mg/dL — ABNORMAL HIGH (ref 0.60–1.30)
EGFR (African American): 19 — ABNORMAL LOW
EGFR (Non-African Amer.): 16 — ABNORMAL LOW
Glucose: 168 mg/dL — ABNORMAL HIGH (ref 65–99)
Osmolality: 292 (ref 275–301)
Potassium: 4.2 mmol/L (ref 3.5–5.1)
Sodium: 132 mmol/L — ABNORMAL LOW (ref 136–145)

## 2012-11-04 LAB — BASIC METABOLIC PANEL
Calcium, Total: 8.6 mg/dL (ref 8.5–10.1)
Co2: 21 mmol/L (ref 21–32)
Creatinine: 2.83 mg/dL — ABNORMAL HIGH (ref 0.60–1.30)
EGFR (African American): 23 — ABNORMAL LOW
EGFR (Non-African Amer.): 20 — ABNORMAL LOW
Glucose: 87 mg/dL (ref 65–99)
Osmolality: 289 (ref 275–301)
Potassium: 4.2 mmol/L (ref 3.5–5.1)

## 2012-11-05 LAB — CREATININE, SERUM
Creatinine: 2.42 mg/dL — ABNORMAL HIGH (ref 0.60–1.30)
EGFR (Non-African Amer.): 24 — ABNORMAL LOW

## 2012-11-06 LAB — CREATININE, SERUM
Creatinine: 2.76 mg/dL — ABNORMAL HIGH (ref 0.60–1.30)
EGFR (African American): 24 — ABNORMAL LOW
EGFR (Non-African Amer.): 21 — ABNORMAL LOW

## 2012-11-14 ENCOUNTER — Ambulatory Visit: Payer: Self-pay | Admitting: Internal Medicine

## 2012-11-20 IMAGING — CR DG CHEST 1V PORT
1 series · 1 of 1 positions shown · non-contrast
Comparison: none

REASON FOR EXAM: shortness of breath
COMMENTS:

[portable]
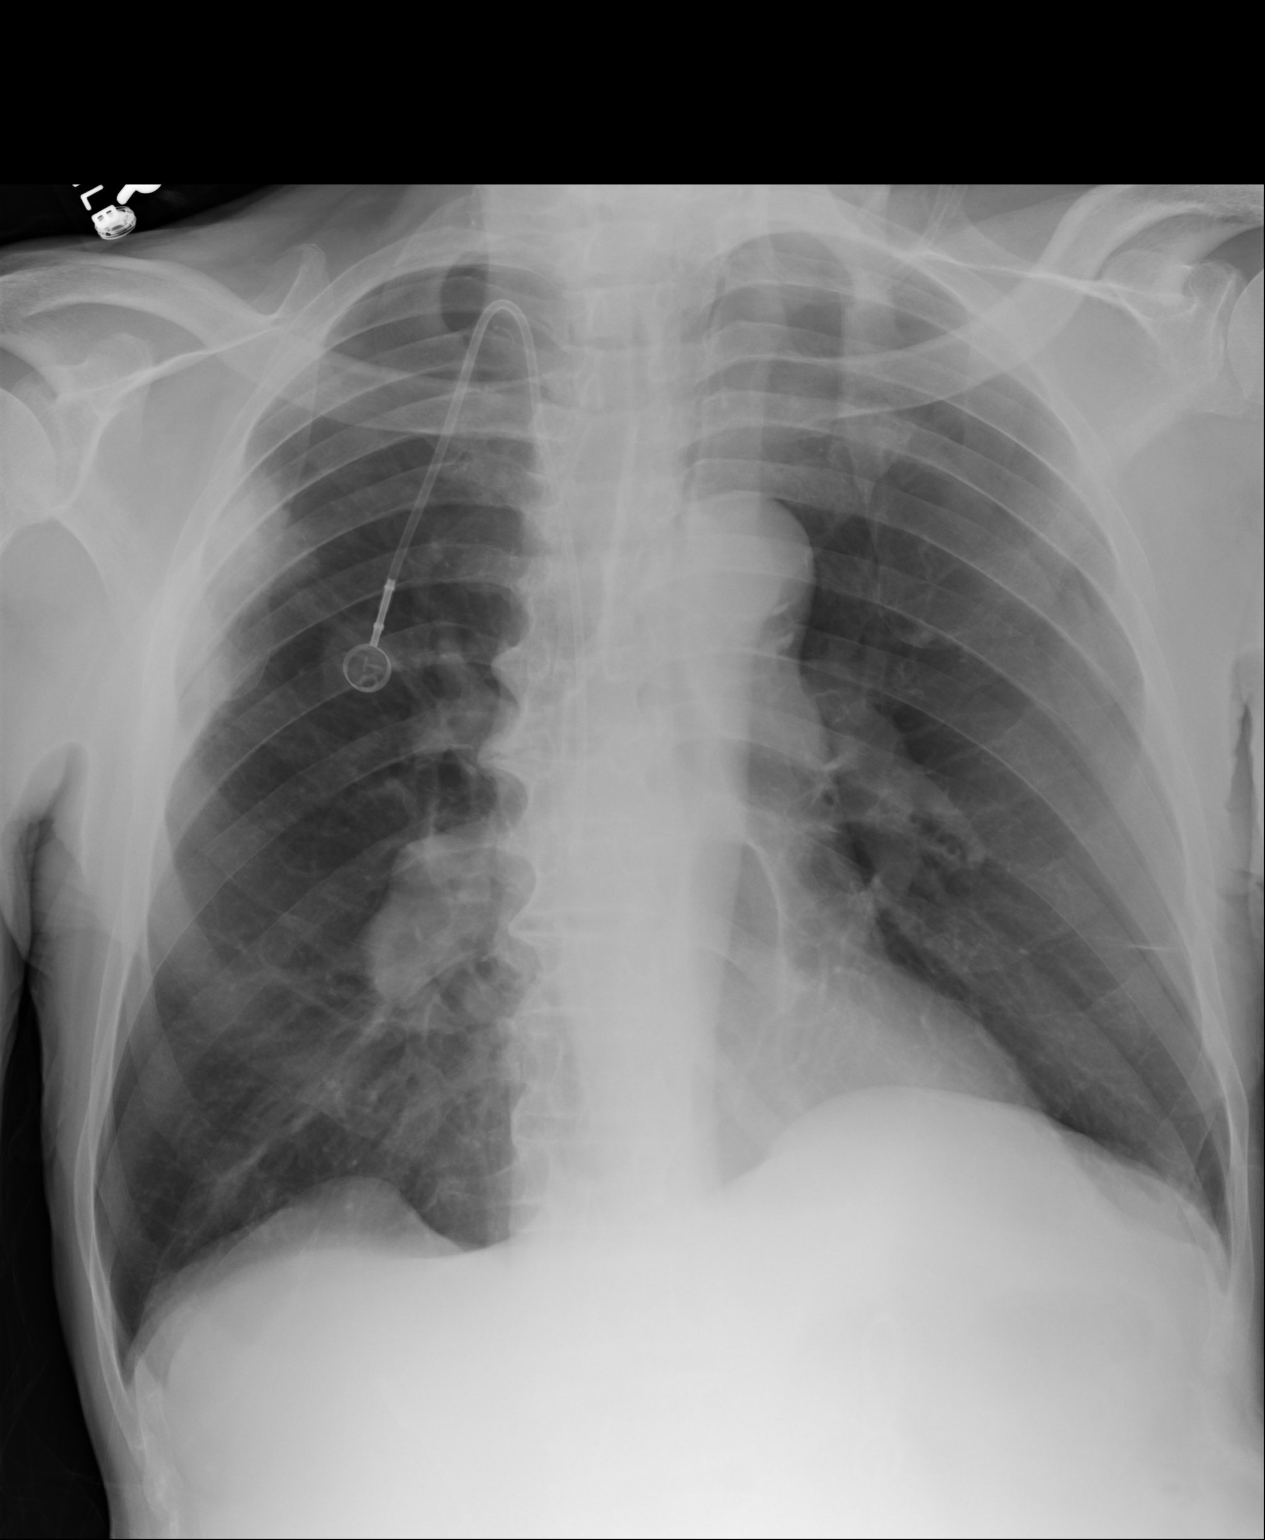

[1 of 1 positions shown; findings below may reference images not displayed]

PROCEDURE:     DXR - DXR PORTABLE CHEST SINGLE VIEW  - December 19, 2011  [DATE]

RESULT:     Comparison is made to the previous examination dated 09 November, 2011.

There is persistent density laterally in the right upper lobe. Hilar
prominence is present bilaterally. The lungs are hyperinflated. Cardiac
silhouette is normal. A Port-A-Cath device is present with the tip of the
catheter in the superior vena cava near the right atrium. There is no
significant interval change since the previous exam. Emphysematous changes
are present especially at the right lung apex.
IMPRESSION: Findings which can be consistent with underlying malignancy
as discussed above. COPD and emphysematous changes are present. No acute
abnormality appreciated.

[REDACTED]

## 2012-11-21 ENCOUNTER — Inpatient Hospital Stay: Payer: Self-pay | Admitting: Internal Medicine

## 2012-11-21 LAB — URINALYSIS, COMPLETE
Bilirubin,UR: NEGATIVE
Nitrite: NEGATIVE
Ph: 9 (ref 4.5–8.0)
Protein: 100
RBC,UR: 826 /HPF (ref 0–5)
Specific Gravity: 1.012 (ref 1.003–1.030)
WBC UR: 349 /HPF (ref 0–5)

## 2012-11-21 LAB — COMPREHENSIVE METABOLIC PANEL
Albumin: 2.4 g/dL — ABNORMAL LOW (ref 3.4–5.0)
Calcium, Total: 9.1 mg/dL (ref 8.5–10.1)
Chloride: 93 mmol/L — ABNORMAL LOW (ref 98–107)
Creatinine: 6.92 mg/dL — ABNORMAL HIGH (ref 0.60–1.30)
EGFR (Non-African Amer.): 7 — ABNORMAL LOW
Glucose: 80 mg/dL (ref 65–99)
Osmolality: 285 (ref 275–301)
SGOT(AST): 12 U/L — ABNORMAL LOW (ref 15–37)
SGPT (ALT): 15 U/L (ref 12–78)
Total Protein: 9.1 g/dL — ABNORMAL HIGH (ref 6.4–8.2)

## 2012-11-21 LAB — CBC
HCT: 33.3 % — ABNORMAL LOW (ref 40.0–52.0)
HGB: 10.9 g/dL — ABNORMAL LOW (ref 13.0–18.0)
MCH: 28.5 pg (ref 26.0–34.0)
MCV: 87 fL (ref 80–100)
RBC: 3.84 10*6/uL — ABNORMAL LOW (ref 4.40–5.90)

## 2012-11-22 LAB — BASIC METABOLIC PANEL
Anion Gap: 8 (ref 7–16)
Anion Gap: 8 (ref 7–16)
BUN: 109 mg/dL — ABNORMAL HIGH (ref 7–18)
BUN: 106 mg/dL — ABNORMAL HIGH (ref 7–18)
Calcium, Total: 9.1 mg/dL (ref 8.5–10.1)
Chloride: 93 mmol/L — ABNORMAL LOW (ref 98–107)
Chloride: 88 mmol/L — ABNORMAL LOW (ref 98–107)
Co2: 23 mmol/L (ref 21–32)
Co2: 26 mmol/L (ref 21–32)
Co2: 33 mmol/L — ABNORMAL HIGH (ref 21–32)
Co2: 35 mmol/L — ABNORMAL HIGH (ref 21–32)
Creatinine: 6.37 mg/dL — ABNORMAL HIGH (ref 0.60–1.30)
Creatinine: 5.56 mg/dL — ABNORMAL HIGH (ref 0.60–1.30)
EGFR (Non-African Amer.): 8 — ABNORMAL LOW
Glucose: 136 mg/dL — ABNORMAL HIGH (ref 65–99)
Glucose: 159 mg/dL — ABNORMAL HIGH (ref 65–99)
Glucose: 180 mg/dL — ABNORMAL HIGH (ref 65–99)
Osmolality: 296 (ref 275–301)
Osmolality: 299 (ref 275–301)
Osmolality: 299 (ref 275–301)
Osmolality: 302 (ref 275–301)
Potassium: 3.7 mmol/L (ref 3.5–5.1)
Potassium: 5.7 mmol/L — ABNORMAL HIGH (ref 3.5–5.1)
Sodium: 126 mmol/L — ABNORMAL LOW (ref 136–145)

## 2012-11-22 LAB — CBC WITH DIFFERENTIAL/PLATELET
Basophil #: 0 10*3/uL (ref 0.0–0.1)
Basophil %: 0.2 %
Eosinophil %: 0.6 %
HGB: 9.7 g/dL — ABNORMAL LOW (ref 13.0–18.0)
Lymphocyte #: 0.3 10*3/uL — ABNORMAL LOW (ref 1.0–3.6)
Lymphocyte %: 2.4 %
MCH: 29 pg (ref 26.0–34.0)
MCHC: 34.1 g/dL (ref 32.0–36.0)
MCV: 85 fL (ref 80–100)
Monocyte #: 1 x10 3/mm (ref 0.2–1.0)
Monocyte %: 9.6 %
Neutrophil #: 9.5 10*3/uL — ABNORMAL HIGH (ref 1.4–6.5)
RBC: 3.36 10*6/uL — ABNORMAL LOW (ref 4.40–5.90)

## 2012-11-23 LAB — BASIC METABOLIC PANEL
Anion Gap: 6 — ABNORMAL LOW (ref 7–16)
BUN: 100 mg/dL — ABNORMAL HIGH (ref 7–18)
Chloride: 81 mmol/L — ABNORMAL LOW (ref 98–107)
Co2: 40 mmol/L (ref 21–32)
EGFR (African American): 11 — ABNORMAL LOW
EGFR (Non-African Amer.): 10 — ABNORMAL LOW
Glucose: 148 mg/dL — ABNORMAL HIGH (ref 65–99)
Osmolality: 289 (ref 275–301)
Sodium: 127 mmol/L — ABNORMAL LOW (ref 136–145)

## 2012-11-24 LAB — BASIC METABOLIC PANEL
BUN: 91 mg/dL — ABNORMAL HIGH (ref 7–18)
Chloride: 89 mmol/L — ABNORMAL LOW (ref 98–107)
EGFR (African American): 12 — ABNORMAL LOW
EGFR (Non-African Amer.): 11 — ABNORMAL LOW
Sodium: 130 mmol/L — ABNORMAL LOW (ref 136–145)

## 2012-12-15 ENCOUNTER — Ambulatory Visit: Payer: Self-pay | Admitting: Internal Medicine

## 2013-01-14 DEATH — deceased

## 2013-03-03 IMAGING — CR DG CHEST 2V
1 series · 2 of 2 positions shown · non-contrast
Comparison: none

REASON FOR EXAM: lung ca
COMMENTS:

PROCEDURE:     DXR - DXR CHEST PA (OR AP) AND LATERAL  - March 31, 2012  [DATE]
RESULT:     Comparison: None

[Series 1: w chest pa · 0.14mm/px · 2 of 2 slices shown]
[im 1/2]
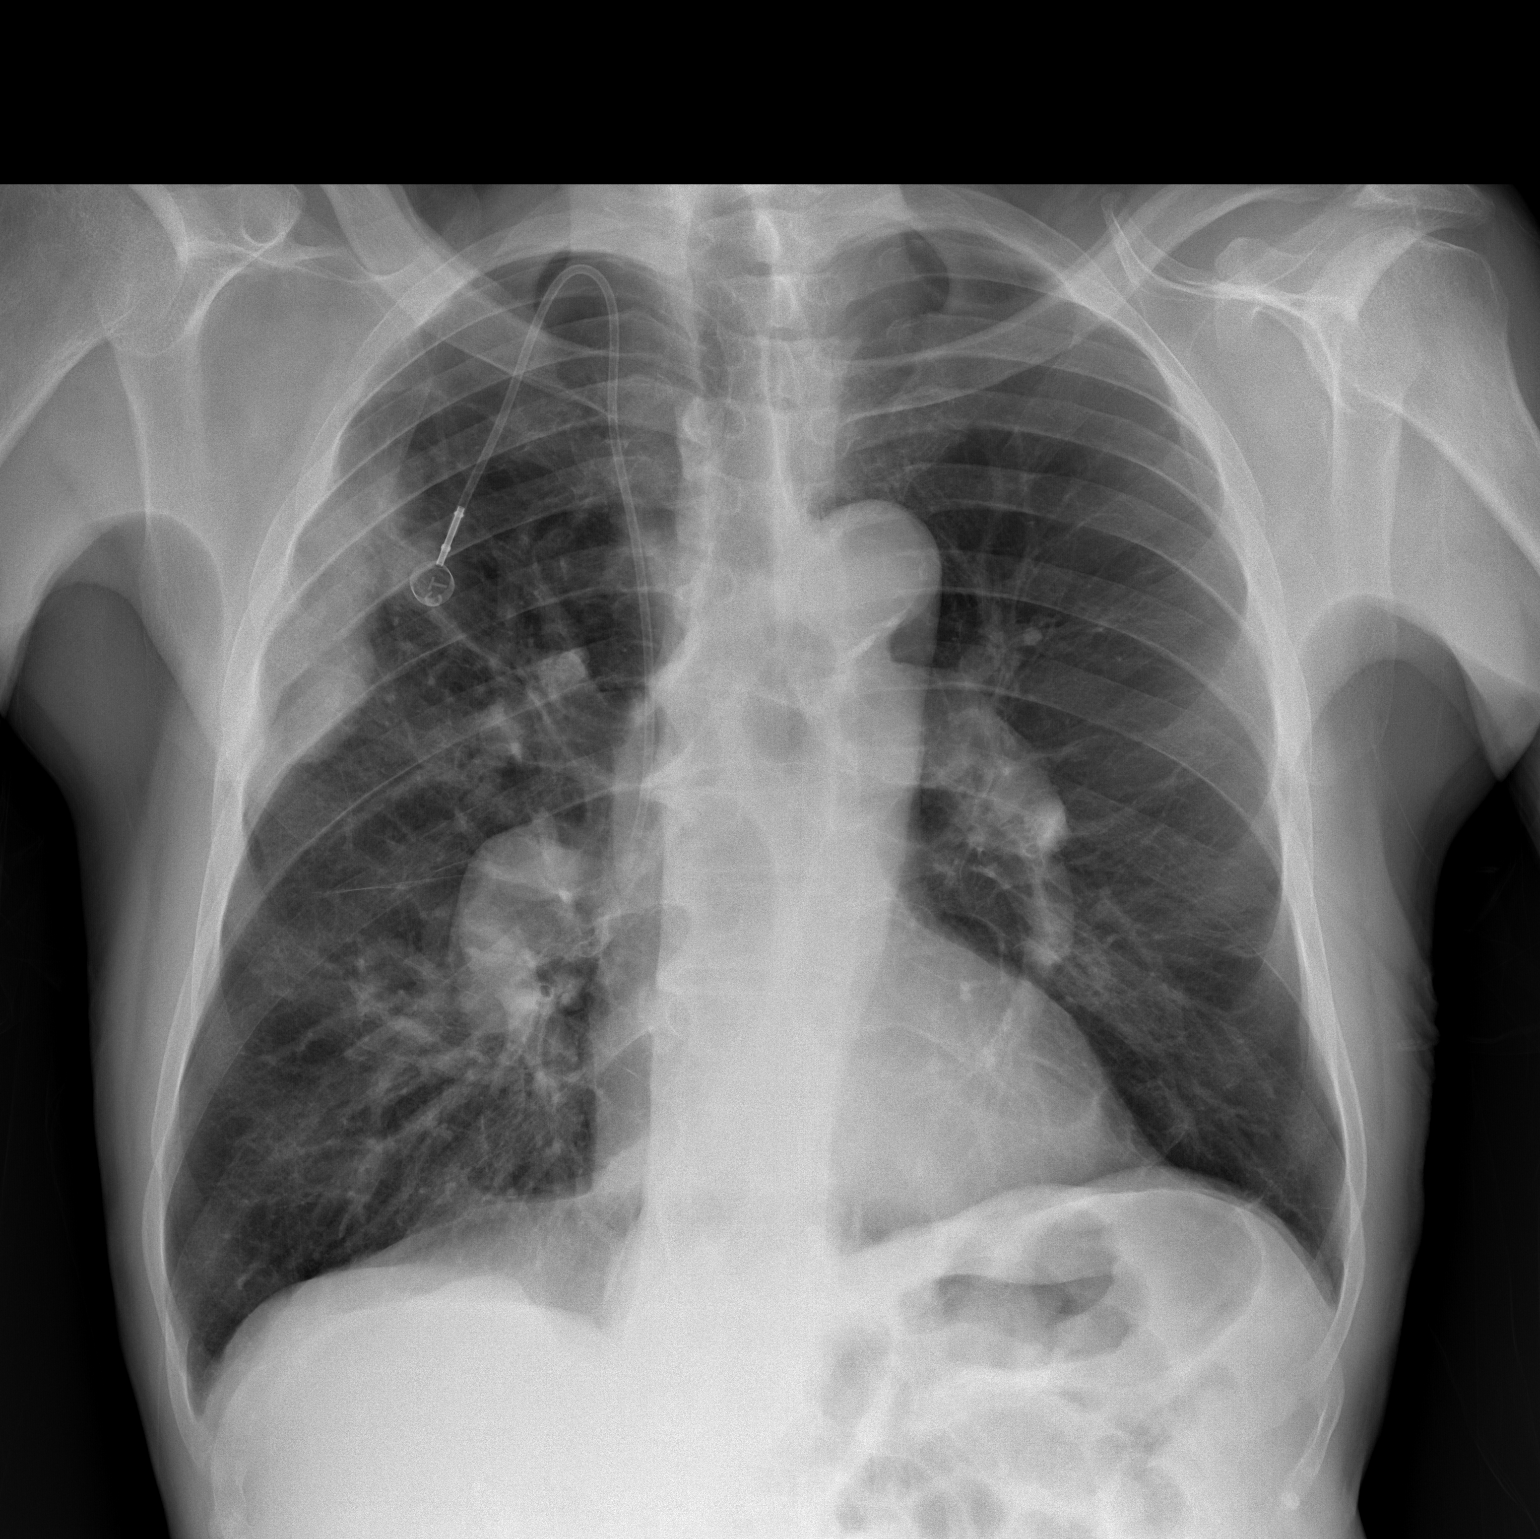
[im 2/2]
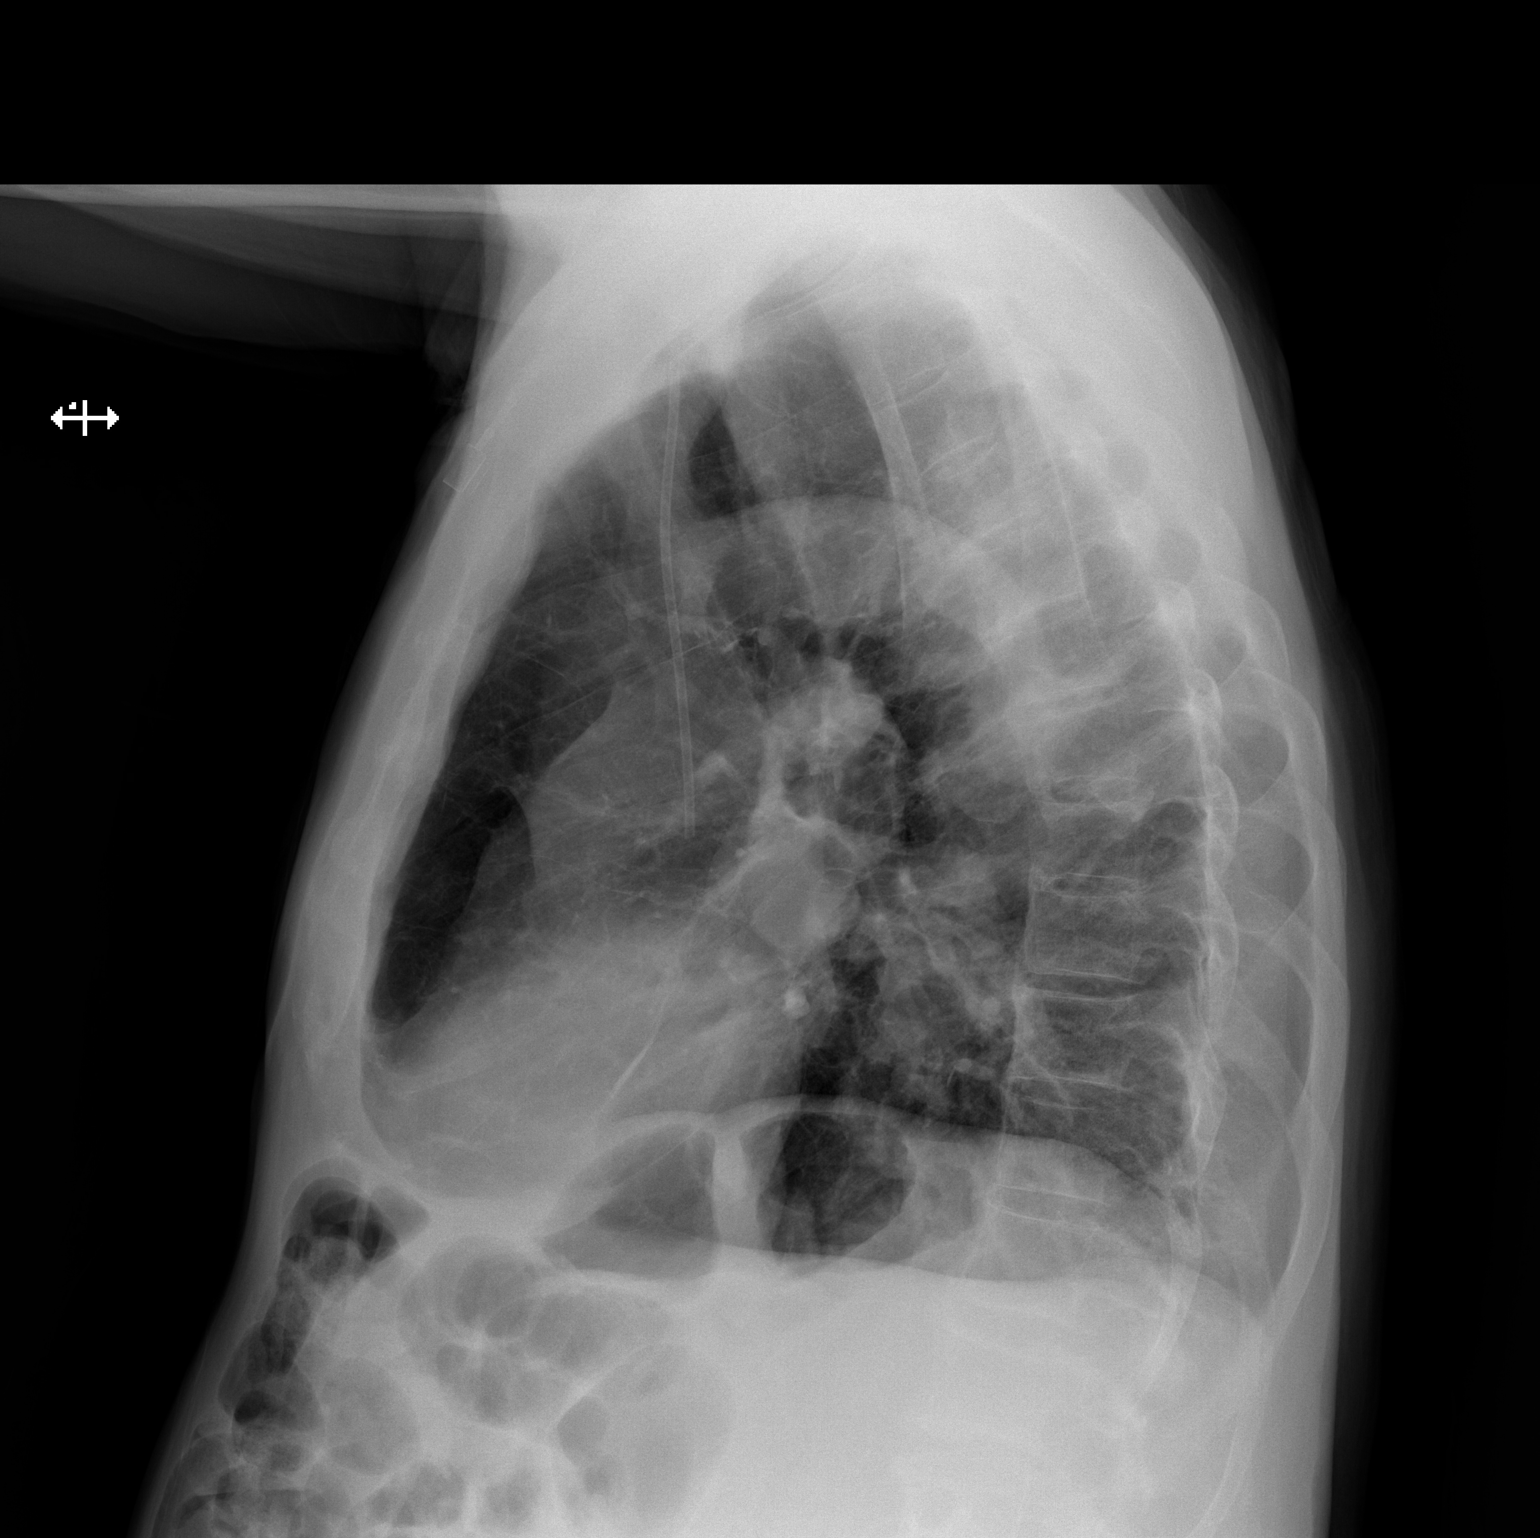

[2 of 2 positions shown; findings below may reference images not displayed]

FINDINGS: PA and lateral chest radiographs are provided. There is a right-sided
Port-A-Cath in satisfactory position. There is a lobulated pleural based
right upper lung mass which is similar in appearance to 02/27/2012
concerning for malignancy. There is no other focal parenchymal opacity,
pleural effusion, or pneumothorax. The heart and mediastinum are
unremarkable.  The osseous structures are unremarkable.
IMPRESSION: Stable lobulated pleural based right upper lobe mass most concerning for
malignancy. No significant interval change compared with 02/27/2012.

[REDACTED]

## 2014-08-03 NOTE — Op Note (Signed)
PATIENT NAME:  Donald [name], [name] MR#:  [id number] DATE OF BIRTH:  1929/04/20  DATE OF PROCEDURE:  12/24/2011  PREOPERATIVE DIAGNOSES:  1. Bilateral ureteral obstruction due to invasive bladder cancer.  2. Renal insufficiency.   POSTOPERATIVE DIAGNOSES:  1. Bilateral ureteral obstructive due to invasive bladder cancer.  2. Renal insufficiency. 3. Bladder stones and encrusted stents.   PROCEDURE PERFORMED:   1. Bilateral stent exchange.  2. Litholapaxy.   SURGEON: Suszanne ConnersMichael R. Evelene CroonWolff, MD  ANESTHETIST: Heriberto AntiguaScott Palmer, MD  ANESTHESIA: General.   INDICATIONS: See the hospital chart and preoperative note. After informed consent, the patient requests the above procedures.   OPERATIVE SUMMARY: After adequate general anesthesia had been attained, the patient was placed into the dorsal lithotomy position and the perineum was prepped and draped in the usual fashion. The 21-French cystoscope was coupled with the camera and then visually advanced into the bladder. Both stents were present but were heavily encrusted. There were also bladder stones present. The bladder stones were easily crumbled with the alligator forceps. The right stent was engaged and removed with alligator forceps. A 0.035 Glidewire was then advanced up the right ureter under fluoroscopic guidance. A 6 x 26 cm double pigtail stent was advanced over the guidewire and positioned in the ureter. The guidewire was then removed taking care to leave the stent in position. The procedure was repeated on the left side in an identical fashion. At this point, the cystoscope was exchanged for a resectoscope. Using the loop, stone fragments and debris were removed. Also the resectoscope sheath was irrigated and remaining debris removed in that manner. Reinspection of the bladder did not reveal any more debris. The scope was then removed and a 20-French silicone catheter placed. The catheter was irrigated until clear. The procedure was then terminated, and  the patient was transferred to the recovery room in stable condition.  ____________________________ Suszanne ConnersMichael R. Evelene CroonWolff, MD mrw:cbb D: 12/24/2011 11:48:18 ET T: 12/24/2011 12:37:49 ET JOB#: [id number]  cc: Suszanne ConnersMichael R. Evelene CroonWolff, MD, <Dictator> Orson ApeMICHAEL R Tayveon Lombardo MD ELECTRONICALLY SIGNED 12/25/2011 8:22

## 2014-08-03 NOTE — Discharge Summary (Signed)
PATIENT NAME:  Donald [name], [name] MR#:  [id number] DATE OF BIRTH:  1929-05-02  DATE OF ADMISSION:  12/19/2011 DATE OF DISCHARGE:  12/27/2011  FINAL DIAGNOSES:  1. Acute on chronic renal failure.  2. Hydronephrosis bilaterally due to transitional cell cancer.  3. Hematuria due to transitional cell cancer.  4. Anemia due to chronic disease and malignancy and hematuria.  5. Procedure performed: Renal stents were changed in the operating room.  6. Procedure performed: Packed red blood cells, 2 units were given on 09/07. 7. Procedure performed: Dialysis catheter was inserted and dialysis performed on three consecutive days.   HISTORY AND PHYSICAL: Dictated on admission on 09/04. As noted, the patient was hospitalized and initially given Kayexalate for hyperkalemia. Nephrology was consulted and urology was consulted and then later vascular surgery was consulted for dialysis access. The patient had daily dialysis until his electrolyte status was stabilized. He was given 2 units of packed red blood cells on 09/07. He had his renal stents bilaterally replaced in the operating room by Dr. Evelene CroonWolff. Subsequent to that hematuria was improving with some residual small volume or microscopic hematuria persisting. His strength and energy improved. His urine output was good and his creatinine was then spontaneously improving further without any additional dialysis.  He was mobilized and ambulating and on 09/12 he was stable for discharge with Home Health and home physical therapy. He was discharged in stable condition with his vital signs stable. His creatinine did improve. It was down to 1.91. His potassium is normal at 4.1. He had performed limited ambulation. His lungs were clear. His heart was regular.  His abdomen was nontender. His urine was clear with slight dusky color but no gross hematuria. He had a Foley prior and that was out and he was voiding spontaneously.   PLAN: Discharged with an appointment with Dr.  Wynelle LinkKolluru of  nephrology in two weeks, but he was to see me on 09/18. He was to resume his Crestor once a day and magnesium oxide.      Otherwise, we discontinued his amlodipine. He was on no other prescription medicines and no pain medications.   ____________________________ Knute Neuobert G. Lorre NickGittin, MD rgg:bjt D: 01/08/2012 17:31:43 ET T: 01/10/2012 08:18:32 ET JOB#: [id number]  cc: Knute Neuobert G. Lorre NickGittin, MD, <Dictator> Marin RobertsOBERT G Gilford Lardizabal MD ELECTRONICALLY SIGNED 01/16/2012 21:02

## 2014-08-03 NOTE — Op Note (Signed)
PATIENT NAME:  Donald [name], [name] MR#:  [id number] DATE OF BIRTH:  May 03, 1929  DATE OF PROCEDURE:  12/19/2011  PREOPERATIVE DIAGNOSES:  1. Acute renal failure in the setting of chronic kidney disease.  2. Uremia.  3. Metabolic acidosis.   POSTOPERATIVE DIAGNOSES:  1. Acute renal failure in the setting of chronic kidney disease.  2. Uremia.  3. Metabolic acidosis.   PROCEDURE:  1. Ultrasound guidance for vascular access, right femoral vein.  2. Placement of right femoral Trialysis catheter.   SURGEON: Annice NeedyJason S. Dew, MD   ANESTHESIA: Local.   ESTIMATED BLOOD LOSS: Minimal.   INDICATION FOR PROCEDURE: The patient is a 81 year old African American male with chronic kidney disease who was admitted with acute renal failure. He requires urgent dialysis due to his metabolic derangement and uremia. A temporary dialysis catheter will be placed.   DESCRIPTION OF PROCEDURE: The patient was laid flat in the bed. His right groin was sterilely prepped and draped, and a sterile surgical field was created. The right femoral vein was patent but very flat consistent with dehydration, and this was accessed with a moderate amount of difficulty with a Seldinger needle, and under direct ultrasound guidance a permanent image was recorded. A J-wire was placed. After skin nick and dilatation, the Trialysis-type dialysis catheter was placed over the wire and the wire removed and secured to the skin at 29 cm with 3 nylon sutures.     All three ports withdrew dark red nonpulsatile blood and flushed easily with heparinized saline.  ____________________________ Annice NeedyJason S. Dew, MD jsd:cbb D: 12/19/2011 16:57:35 ET T: 12/19/2011 18:00:30 ET JOB#: [id number]  cc: Annice NeedyJason S. Dew, MD, <Dictator> Annice NeedyJASON S DEW MD ELECTRONICALLY SIGNED 12/23/2011 14:42

## 2014-08-03 NOTE — Consult Note (Signed)
PATIENT NAME:  Donald Donald [name], Donald Donald [name] MR#:  [id number] DATE OF BIRTH:  1929/06/28  DATE OF CONSULTATION:  12/19/2011  REFERRING PHYSICIAN:   CONSULTING PHYSICIAN:  Annice NeedyJason S. Addalie Calles, MD  REASON FOR CONSULTATION: Need for dialysis catheter with acute renal failure, hyperkalemia.   HISTORY OF PRESENT ILLNESS: The patient is Donald [name] 81 year old male who was admitted after [facility] today. His creatinine is up over 9, his potassium is greater than 6, and he needs urgent dialysis today. He has Donald [name] history of chronic kidney disease. He generally feels poorly. He has not had fevers or chills. Urine output has been reduced as well as having hematuria.   PAST MEDICAL HISTORY: 1. Stage IV transitional cell cancer.  2. Diabetes.  3. Hypertension.  4. Hyperlipidemia.   SOCIAL HISTORY: Still smoking. Previous alcohol abuse.   FAMILY HISTORY: Noncontributory.   ALLERGIES: None known.   MEDICATIONS:  1. Mag-Ox 400 b.i.d.  2. Crestor 10 daily. 3. Amlodipine 5 daily.  REVIEW OF SYSTEMS: Positive for malaise and weakness. No fevers or chills. EYES: No blurred or double vision. EARS: No tinnitus or ear pain. CARDIAC: No chest pain or palpitations. RESPIRATORY: No shortness breath or cough. GI: No nausea, vomiting, diarrhea. GU: No dysuria. Positive for hematuria. Positive for decreased urine output. ENDOCRINE: No heat or cold intolerance. PSYCH: No anxiety or depression. NEUROLOGIC: No TIA, stroke, or seizure.   PHYSICAL EXAMINATION:   GENERAL: This is an ill appearing elderly male who is seen immediately after ultrasound in the holding area for special procedures.   HEENT: Normocephalic, atraumatic.   EYES: Sclerae nonicteric. Conjunctivae are clear.   EARS: Normal external appearance. Hearing is intact.   NECK: Supple without adenopathy or JVD.   HEART: Regular rate and rhythm.   LUNGS: Clear to auscultation bilaterally.   ABDOMEN: Soft, nondistended, nontender.   EXTREMITIES:  Mild lower extremity edema.   LABORATORY, DIAGNOSTIC, AND RADIOLOGICAL DATA: Laboratory values as described above.   ASSESSMENT AND PLAN: This is an ill appearing, elderly male who needs acute dialysis. Temporary dialysis catheter to be placed today.   This is Donald [name] Runner, broadcasting/film/videoLevel-3 consultation.   ____________________________ Annice NeedyJason S. Aldora Perman, MD jsd:drc D: 01/23/2012 11:31:44 ET T: 01/23/2012 12:17:14 ET JOB#: [id number]  cc: Annice NeedyJason S. Joanne Brander, MD, <Dictator> Annice NeedyJASON S Revin Corker MD ELECTRONICALLY SIGNED 01/24/2012 10:15

## 2014-08-03 NOTE — Consult Note (Signed)
Chief Complaint:   Subjective/Chief Complaint No complaints Kaziah Krizek/p surgery. No acute interval events   VITAL SIGNS/ANCILLARY NOTES: **Vital Signs.:   09-Sep-13 04:42   Vital Signs Type Routine   Temperature Temperature (F) 98.5   Celsius 36.9   Temperature Source Oral   Pulse Pulse 79   Respirations Respirations 20   Systolic BP Systolic BP 100   Diastolic BP (mmHg) Diastolic BP (mmHg) 62   Mean BP 74   Pulse Ox % Pulse Ox % 95   Pulse Ox Activity Level  At rest   Oxygen Delivery Room Air/ 21 %   Brief Assessment:   Cardiac Regular    Respiratory normal resp effort    Gastrointestinal details normal Soft  Nontender   Assessment/Plan:  Assessment/Plan:   Assessment Metastatic bladder ca sp surgery.  H/o copd  and HTN    Plan Tolerated procedure well, VSS stable. No further recommendations at this point, will sign off. Please call with any further questions.   Electronic Signatures: Charlesetta Garibaldiejan-Sie, Sheikh A (MD)  (Signed 09-Sep-13 13:17)  Authored: Chief Complaint, VITAL SIGNS/ANCILLARY NOTES, Brief Assessment, Assessment/Plan   Last Updated: 09-Sep-13 13:17 by Charlesetta Garibaldiejan-Sie, Sheikh A (MD)

## 2014-08-03 NOTE — Consult Note (Signed)
Patient admitted with uremia, ARF from CKD, now needs HD. Will plan placement of a temporary dialysis catheter this afternoon, but patient currently in US and not available.  Electronic Signatures: Annice Needyew, Khyle Goodell S (MD)  (Signed on 04-Sep-13 15:53)  Authored  Last Updated: 04-Sep-13 15:53 by Annice Needyew, Alene Bergerson S (MD)

## 2014-08-03 NOTE — Consult Note (Signed)
PATIENT NAME:  Donald Donald [name], Donald Donald [name] MR#:  [id number] DATE OF BIRTH:  1930/01/11  DATE OF CONSULTATION:  12/23/2011  REFERRING PHYSICIAN:   CONSULTING PHYSICIAN:  Corky DownsJaved Haru Shaff, MD  HISTORY OF PRESENT ILLNESS: Mr. Donald Donald [name] was admitted into the hospital for acute on chronic renal failure. The patient was found to have an electrolyte imbalance, hyponatremia and hyperkalemia. He was dialyzed several times, also was given blood transfusion. The patient is known to have bladder cancer and has been treated in the past with radiation therapy and chemotherapy. He has to have Donald [name] stent placement so Donald [name] consultation is being obtained to clear him for surgery. He denies any history of chest pain. He has Donald [name] history of mini stroke in the past but does not have any symptoms right now. There is no history of nausea, vomiting, sweating, or arrhythmia.   PAST MEDICAL HISTORY:  1. 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 2. History of alcohol and tobacco abuse in the past.   MEDICATIONS:  1. Magnesium oxide 400 mg twice Donald [name] day. 2. Crestor 10 mg p.o. daily.  3. Amlodipine 5 mg p.o. daily.   PHYSICAL EXAMINATION:   GENERAL: The patient is Donald [name] well nourished male in no acute distress at the time of examination.   HEENT: Unremarkable.   NECK: Supple. There is no bruit. No mass in the neck.   CARDIOVASCULAR: Apical impulse is not palpable. Both heart sounds are normal. No murmur is audible.   CHEST: Decreased breath sounds without any rales or rhonchi. There is no pedal edema.   ABDOMEN: Soft, nontender.   IMPRESSION:  1. Acute on chronic renal failure. 2. Bilateral hydronephrosis. 3. Transitional cell cancer of the bladder with metastasis.  4. Anemia.  5. Hyponatremia.  RECOMMENDATIONS: The patient's general condition is stable at the present time and I don't feel any contraindication for planned surgery for tomorrow.   ____________________________ Corky DownsJaved Verenis Nicosia,  MD jm:drc D: 12/23/2011 13:46:33 ET T: 12/23/2011 14:46:30 ET JOB#: [id number]  cc: Corky DownsJaved Shyann Hefner, MD, <Dictator> Corky DownsJAVED Kreed Kauffman MD ELECTRONICALLY SIGNED 01/11/2012 18:58

## 2014-08-03 NOTE — H&P (Signed)
PATIENT NAME:  Donald [name], Donald [name] MR#:  [id number] DATE OF BIRTH:  Mar 23, 1930  DATE OF ADMISSION:  12/19/2011  HISTORY OF PRESENT ILLNESS: Mr. Donald [name] is [name] 81 year old patient who is admitted after initial evaluation in the [facility]. He is hospitalized for acute on chronic renal failure with his creatinine up to over 9 and hyperkalemia with potassium of 6.2, and also hyponatremia with sodium 133. He has increased fatigue. He has had some mild nausea and anorexia. He has not been vomiting. He had very limited p.o. intake yesterday. He has minimal urine output and he has hematuria. It is unclear how much volume of urine he's had in the last 24 hours. He has some mild low backache intermittently. He has no fever or chills. He has denied headache or dizziness, not coughing. He has not been short of breath. He has not had rash or bruising. He does not have abdominal pain, does not have diarrhea, and does not have edema. He does not have focal weakness. He does not have numbness or tingling of extremities.   PAST MEDICAL HISTORY: Past medical history is notable for stage IV metastatic transitional cell cancer of the bladder metastatic to the lung. He is status post palliative radiation to the bladder in the past. He is status post Taxol and carboplatin therapy, more recently Gemzar, carboplatin, and single agent Gemzar therapy. He had [name] recent restaging CT with persistent but stable disease in the lung. He has recently increased creatinine. He is due for [name] change of his bilateral renal stents. Additional history includes diabetes and hypertension. He has had hyperlipidemia. In the past back in 1987 he's had nephrostomy tubes.   FAMILY HISTORY: Noncontributory.   SOCIAL HISTORY: In the past alcohol use, chronic tobacco history, still smokes.   ALLERGIES: No known allergies.  MEDICATIONS: 1. Magnesium oxide 400 mg twice [name] day. 2. Crestor 10 mg daily.  3. Amlodipine 5 mg daily.   PHYSICAL EXAMINATION:    GENERAL: He was alert and cooperative, thin, sluggish but alert and oriented.   HEENT: No jaundice of the sclerae. No thrush in the mouth.   NECK: No mass in the neck.   LYMPH: No nodes palpable in the neck, supraclavicular, submandibular, or axilla.   LUNGS: Some decreased air entry in the right apex. He has some generally decreased air entry overall. He does not have wheezing or rales.   HEART: Regular.   ABDOMEN: Nontender. No palpable mass.   EXTREMITIES: No edema. He has muscle wasting. He has no rash or bruising.   NEUROLOGIC: Grossly nonfocal.   LABORATORY DATA: Liver functions were unremarkable. Creatinine 9.4, potassium 6.2, sodium 133, CO2 17. CBC is unremarkable.   IMPRESSION:  1. Acute renal failure on chronic renal failure.  2. Bilateral hydronephrosis, chronic, likely progressing.  3. Metastatic transitional cell cancer including to the lung, recently slowly progressing then stable disease. He could not tolerate carboplatin therapy. Recently poor tolerance of single agent Gemzar.   4. Hematuria. No signs of infection.  5. Hyponatremia.  6. His oxygen saturations were low but his poor peripheral circulation is likely not [name] true reading, has not been short of breath.  PLAN: 1. He will be hospitalized and check his oxygen level. Use O2 p.r.n.  2. Will get [name] baseline chest x-ray.  3. Initially keep him n.p.o. with low volume normal saline IV.  4. Will give [name] dose of Kayexalate for the hyperkalemia and will repeat the metabolic panel.  5. I have consulted  Nephrology and stat discussed with Dr. Thedore Mins and Dr. Cherylann Ratel will see the patient later today.  6. I have discussed with Dr. Evelene Croon who recommends the patient currently be scheduled for and keep appointment for outpatient changing of his renal stents if he is stable and cleared for anesthesia. If unable to stent or if no improvement in creatinine, might consider value of nephrostomy tube. Currently recommended against  nephrostomy or any other imaging. The patient may need [name] line placed for dialysis.   ____________________________ [name], MD rgg:drc D: 12/19/2011 13:37:36 ET T: 12/19/2011 14:13:34 ET JOB#: [id number]  cc: Knute Neu. Lorre Nick, MD, <Dictator> Marin Roberts MD ELECTRONICALLY SIGNED 12/22/2011 20:53

## 2014-08-06 NOTE — H&P (Signed)
PATIENT NAME:  Donald, [name] MR#:  [id number] DATE OF BIRTH:  [birth year]  DATE OF ADMISSION:  11/21/2012  PRIMARY CARE PROVIDER: Dr. Benita Gutter.  EMERGENCY DEPARTMENT REFERRING PHYSICIAN: Dr. Mayford Knife.   CHIEF COMPLAINT: Generalized weakness, not feeling well.   HISTORY OF PRESENT ILLNESS: The patient is an 81 year old African American male who is normally followed in the oncology clinic, who was last hospitalized on 07/16 with acute on chronic renal failure with hyperkalemia, who has metastatic  bladder cancer, diffuse. He was discharged to a skilled nursing facility last month, comes in with generalized weakness, not feeling well. The patient is again noted to have severe hyperkalemia of 7.5.  His creatinine is 6.92 and his sodium is 121. We are asked to admit the patient for these abnormalities. To note, his last creatinine on July 24 was 2.76. His potassium last on July 22 was 4.2. The patient otherwise denies any chest pains. No shortness of breath. No fevers or chills. No abdominal pain, nausea, vomiting or diarrhea. He reports decrease in appetite.   PAST MEDICAL HISTORY:  1.  Hypertension.  2.  Diabetes.  3.  Metastatic bladder cancer, status post multiple treatments.  4.  Recurrent hydronephrosis with placement and replacement of double pigtail renal stents.  5.  Hyperlipidemia.  6.  Status post bladder tumor resection in the past with nephrostomy tubes in the past.   ALLERGIES: None.   CURRENT MEDICATIONS: Cipro 500 mg 1 tab p.o. b.i.d., lactulose 15 mL q.12, mag oxide 400 mg 1 tab p.o. b.i.d., oxycodone 5 mg q.6 p.r.n., Remeron 15 at bedtime, Tylenol 650 q.4 p.r.n.   FAMILY HISTORY: Positive for hypertension.   SOCIAL HISTORY: Previous history of alcohol use. History of smoking in the past. No drug use. Currently resides at a skilled nursing facility.   REVIEW OF SYSTEMS:    CONSTITUTIONAL: Denies any fevers. Complains of fatigue, weakness. No pain. Has weight loss.   EYES: No blurred or double vision. No pain. No redness. No inflammation. No glaucoma.  EARS, NOSE, THROAT: No tinnitus. No ear pain. No hearing loss. No seasonal or year-round allergies. No epistaxis. No difficulty swallowing.  RESPIRATORY: Denies any cough, wheezing, hemoptysis. Apparently did have some hypoxia at the skilled nursing facility. No COPD. CARDIOVASCULAR: No chest pain. No orthopnea. No edema. No arrhythmia.  GASTROINTESTINAL: No nausea, vomiting, diarrhea. No abdominal pain. No hematemesis. No melena.  GENITOURINARY: Denies any dysuria.  ENDOCRINE: Denies any polyuria, nocturia or thyroid problems. HEMATOLOGIC AND LYMPHATIC: Denies any major bruisability or bleeding.  SKIN: No acne. No rash. No changes in mole, hair or skin.  MUSCULOSKELETAL: No pain in neck, back or shoulder.  NEUROLOGIC: No numbness. No CVA. No TIA. No seizures.  PSYCHIATRIC: No anxiety. No insomnia. No ADD.   PHYSICAL EXAMINATION: VITAL SIGNS: Temperature 98.8, pulse 98, respirations 20, blood pressure 106/71. GENERAL: The patient is a very frail-looking male.  HEENT: Head atraumatic, normocephalic. Pupils equally round, reactive to light and accommodation. There is no conjunctival pallor. No scleral icterus. Nasal exam shows no drainage or ulceration. Oropharynx is clear without any exudate. External ear exam shows no drainage or ulceration.  NECK: Supple without any JVD. No carotid bruits.  CARDIOVASCULAR: Regular rate and rhythm. No murmurs, rubs, clicks or gallops. PMI is not displaced.  LUNGS: Decreased breath sounds without any rales, rhonchi, wheezing.  ABDOMEN: Soft, nontender, nondistended. Positive bowel sounds x 4. No hepatosplenomegaly.  EXTREMITIES: No clubbing, cyanosis or edema.  SKIN: No rash.  LYMPHATICS: No  lymph nodes palpable.  VASCULAR: Good DP, PT pulses.  PSYCHIATRIC: Not anxious or depressed.  NEUROLOGICAL: Awake, alert, oriented x 3. No focal deficits.   LABORATORY DATA: ABG  with pH of 7.29, pCO2 of 34, pO2 of 119. WBC 14.5, hemoglobin 10.9, platelet count 505. Glucose 80, BUN 131, creatinine 6.92, sodium 121, potassium 7.5, chloride (Dictation Anomaly) <<MISSING TEXT>>. His CO2 is 18. LFTs: AST 12, total protein 9.1, albumin 2.4. CT scan of the head shows no involutional changes, without any acute abnormality.   HOSPITAL COURSE: The patient is an 81 year old white male with bladder cancer with widespread metastasis, who is brought in with acute renal failure, hyperkalemia, bilateral hydronephrosis.  1.  Hyperkalemia: Likely due to bilateral hydronephrosis, progressive metastatic bladder cancer. At this time, he is given Kayexalate, D50, glucose, insulin. Will repeat his BMP every 6 hours. I will place him on bicarbonate-containing intravenous fluids. Will monitor him on telemetry. I have spoken to Dr. Thedore MinsSingh about his case. We will also place a Foley.  2.  Metastatic cancer: Prognosis very poor, untreatable at this time with recurrent acute renal failure. The patient's mortality is very high. I will ask palliative care team to come assist. He has been followed by hospice in the past. He may benefit from hospice home.  3.  (Dictation Anomaly) <<MISSING TEXT>>: Will hold antihypertensives.  4.  History of diabetes: Not on any treatment. I will place him on sliding scale insulin.  5.  Hyponatremia: Likely due to volume depletion. Will give him intravenous fluids and follow his sodium.  6.  Code status: DO NOT RESUSCITATE. Will ask palliative care to assist with determining goal of care.  In light of his critical potassium, he needs to be monitored very closely. The patient is at high risk of cardiopulmonary arrest. This is a critical care history and physical.  TIME SPENT: 50 minutes.    ____________________________ Lacie ScottsShreyang H. Allena KatzPatel, MD shp:jm D: 11/21/2012 19:05:49 ET T: 11/21/2012 20:37:16 ET JOB#: [id number]  cc: Oneita Allmon H. Allena KatzPatel, MD, <Dictator>

## 2014-08-06 NOTE — Consult Note (Signed)
Brief Consult Note: Diagnosis: Metastatic bladder cancer with ureteral obstruction s/p stent exchange 08/11/12.   Patient was seen by consultant.   Consult note dictated.   Recommend further assessment or treatment.   Discussed with Attending MD.   Comments: Probably dehydrated and progressive renal failure. Doubt that obstruction of stents is a factor.  Electronic Signatures: Orson ApeWolff, Marquavis Hannen R (MD)  (Signed 17-Jul-14 15:33)  Authored: Brief Consult Note   Last Updated: 17-Jul-14 15:33 by Orson ApeWolff, Jasreet Dickie R (MD)

## 2014-08-06 NOTE — H&P (Signed)
PATIENT NAME:  Donald [name], Kiyan A MR#:  [id number] DATE OF BIRTH:  [birth year]  DATE OF ADMISSION:  08/11/2012  The patient is to have same day surgery, April 28.  CHIEF COMPLAINT:  Obstructed ureters.   HISTORY OF PRESENT ILLNESS:  The patient is an 81 year old African American male with metastatic and invasive bladder cancer with ureteral obstruction. He comes in today for bilateral stent exchange. Most recent stent exchange was performed December 19, 2011.   ALLERGIES: No drug allergies.   CHRONIC HOME MEDICATIONS: Include oxycodone, dexamethasone, lactulose, amlodipine, magnesium oxalate and Crestor.   PREVIOUS SURGICAL PROCEDURES: Included transurethral resection of bladder tumors in 2005, 2006, 2008. Stent exchanges in 2008, 2009, 2010, 2011, 2012 and 2013.   SOCIAL HISTORY:  The patient smokes 1 to 2 packs of cigarettes a day, has a greater than 50 pack-year history. He has a history of alcohol abuse.   FAMILY HISTORY:  Noncontributory.   PAST AND CURRENT MEDICAL CONDITIONS:  1.  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  2.  Renal insufficiency.  3.  Diabetes.  4.  Hypertension.  5.  Chronic anemia.   REVIEW OF SYSTEMS:  The patient denied chest pain or shortness of breath.   PHYSICAL EXAMINATION:  GENERAL:  A thin African American male, in no acute distress.  HEENT:  Sclerae were clear. Pupils were equal, round and reactive to light and accommodation. Extraocular movements were intact.  NECK:  Supple. No palpable cervical adenopathy. No audible carotid bruits.  LUNGS:  Clear to auscultation.  CARDIOVASCULAR:  Regular rhythm and rate without audible murmurs.  ABDOMEN:  Soft, nontender abdomen.  GENITOURINARY AND RECTAL:  Deferred.  NEUROMUSCULAR:  Alert and oriented x 3.   IMPRESSION: 1.  Bilateral ureteral obstruction due to invasive bladder cancer.  2.  Metastatic bladder cancer.  3.  Renal insufficiency.   PLAN:  Cystoscopy  with bilateral stent exchange.     ____________________________ Suszanne ConnersMichael R. Evelene CroonWolff, MD mrw:dmm D: 08/06/2012 13:16:00 ET T: 08/06/2012 13:31:00 ET JOB#: [id number]  cc: Suszanne ConnersMichael R. Evelene CroonWolff, MD, <Dictator> Orson ApeMICHAEL R Laiklynn Raczynski MD ELECTRONICALLY SIGNED 08/07/2012 11:10

## 2014-08-06 NOTE — Discharge Summary (Signed)
PATIENT NAME:  Donald [name], [name] MR#:  [id number] DATE OF BIRTH:  1929/06/02  DATE OF ADMISSION:  10/29/2012 DATE OF DISCHARGE:  11/06/2012   FINAL DIAGNOSES:   1.  Acute on chronic renal failure.  2.  Hyperkalemia.  3.  Progressive bladder cancer with widespread metastasis.  4.  Hydronephrosis.  5.  Anemia due to malignancy and chronic renal failure.  6.  Failure to thrive.   HISTORY AND PHYSICAL: Dictated on admission.   CONSULTATIONS: With nephrology and urology.  PROCEDURE: Transfusion of 1 unit of packed red blood cells during hospitalization.   HOSPITAL COURSE: The patient was admitted. His hyperkalemia was corrected on day 1 with an outpatient dose of Kayexalate. He was given IV fluids and that gradually improved his creatinine. A CT documented the status of his intra-abdominal disease. Urology and nephrology saw him, but there were no acute measures to be taken. His creatinine improved slowly. His hemoglobin was supported with a transfusion, with hemoglobin of 7.9 that came up to 8.8. The patient remained weak with a poor appetite. He did work with physical therapy. He had some mobility, but he was extremely weak and was unsafe to be independent. He was recommended for 24-hour supervision and none is available in the home. He is appropriate for placement for continued physical therapy. He will be evaluated p.r.n. for hospice services at the time of decline from cancer. He will continue to have palliative supportive care p.r.n. in the Cancer Center. He has no other chemotherapy planned, has failed prior therapies. He has also requested NO CODE STATUS based on our discussion and his understanding of the implications of no code, no resuscitation, no aggressive measures for life support.   ____________________________ Knute Neuobert G. Lorre NickGittin, MD rgg:jm D: 11/06/2012 18:51:00 ET T: 11/06/2012 19:06:54 ET JOB#: [id number]  cc: Knute Neuobert G. Lorre NickGittin, MD, <Dictator> Marin RobertsOBERT G Basilia Stuckert MD ELECTRONICALLY  SIGNED 11/18/2012 9:36

## 2014-08-06 NOTE — Consult Note (Signed)
PATIENT NAME:  Donald [name], [name] MR#:  [id number] DATE OF BIRTH:  1929/07/23  DATE OF CONSULTATION:  10/30/2012  REFERRING PHYSICIAN:   Benita Gutterobert Gittin, MD  CONSULTING PHYSICIAN:  Suszanne ConnersMichael R. Evelene CroonWolff, MD  REASON FOR CONSULTATION:  Bladder cancer.   HISTORY OF PRESENT ILLNESS:  Mr. Donald [name] is an 81 year old African American male with metastatic and locally invasive bladder cancer who has had bilateral stents in place for several years. He typically comes in at 3739-month intervals to have the stents exchanged. The most recent exchange was April 28th of this year. He essentially was admitted to the hospital today with progressive weakness, nausea and failure to thrive. Laboratory studies indicated a BUN 80 and a creatinine of 4.64. A urological consultation was requested. The patient has some back pain but the pain does not lateralize. He also has had some hematuria. The hematuria has been intermittent. He denied fever and chills.   ALLERGIES:  No drug allergies.   CHRONIC HOME MEDICATIONS:  Include Oxycodone, dexamethasone, lactulose, amlodipine, magnesium oxalate and Crestor.   PAST SURGICAL PROCEDURES INCLUDE:  Transurethral resection of bladder tumors in 2005, 2006, 2008 and stent exchanges at biannual occasions since 2008, most recently 08/11/2012.   SOCIAL HISTORY:  The patient smokes 1 to 2 packs of cigarettes day and has a greater than 50 pack-year history. He has a history of alcohol abuse.   FAMILY HISTORY:  Noncontributory.   PAST AND CURRENT MEDICAL CONDITIONS:  1.  Stage IV metastatic transitional cell carcinoma bladder with lung metastasis and local invasion.  2.  Renal insufficiency.  3.  Diabetes.  4.  Hypertension.  5.  Chronic anemia.  REVIEW OF SYSTEMS:  The patient denied chest pain or shortness of breath.   PHYSICAL EXAMINATION: GENERAL:  A cachectic, African American male in distress.  ABDOMEN: Soft. No CVA tenderness.   PERTINENT LABORATORY STUDIES:  Include BUN of 80 and  a creatinine of 4.64. Today, hemoglobin was 7.7.   IMPRESSION:  Stage IV transitional cell carcinoma with lung metastases and local invasive disease with ureteral obstruction.   SUGGESTIONS:  It is doubtful that the recently replaced stents are contributing to his azotemia. Most likely he has progression of his renal disease or dehydration. We will need to schedule a stent exchange in October.   ____________________________ Suszanne ConnersMichael R. Evelene CroonWolff, MD mrw:jm D: 10/30/2012 15:39:00 ET T: 10/30/2012 15:53:20 ET JOB#: [id number]  cc: Suszanne ConnersMichael R. Evelene CroonWolff, MD, <Dictator> Orson ApeMICHAEL R Sencere Symonette MD ELECTRONICALLY SIGNED 10/31/2012 8:29

## 2014-08-06 NOTE — H&P (Signed)
PATIENT NAME:  Donald [name], Donald [name] MR#:  [id number] DATE OF BIRTH:  Oct 02, 1929  DATE OF ADMISSION:  04/25/2012  HISTORY OF PRESENT ILLNESS:  This is an 81 year old patient well known to me who came to the office today for routine followup, but is admitted as he was found to have hyperkalemia, acute on chronic renal failure, creatinine up to 3.87, also has had productive cough for over [name] week, presents with yellow and green sputum. He has been taking Levaquin p.o. as an outpatient. He does not have fever, chills, or sweats and he was not short of breath. He has some discomfort in the chest from coughing, but no other chest pain and no pleuritic pain. No nausea, vomiting, or diarrhea. No abdominal pain. Denies hematuria. No extremity edema. No new bone pain.   PAST MEDICAL HISTORY:  Transitional cell cancer of the bladder metastatic to the bilateral lungs. He has had radiation treatments of bladder mets. He has had chemotherapies in the past. He has renal stents. History also includes hypertension and diabetes. He has had anemia with transfusions. Prior _____ acute renal failure required dialysis. The most recent chemotherapy was single agent Gemzar 2 weeks on  and 1 week off. He was last treated in December. He was planned to resume on January 6th, but was held for intercurrent illness and was held today due to illness.   ALLERGIES:  No known allergies.   Additional history includes hyperlipidemia and CT-guided biopsy of the lung mass and the bladder resections, prior nephrostomies.   FAMILY HISTORY:  Noncontributory.   SOCIAL HISTORY:  Chronic alcohol use, also long-term smoker.   HOME MEDICATIONS:  Magnesium 400 mg 1 twice [name] day, Crestor 10 mg once [name] day at night, amlodipine 10 mg daily, famotidine 20 mg daily, recently Levaquin 250 mg p.o. daily, lisinopril/hydrochlorothiazide dose not recorded.   PHYSICAL EXAMINATION: GENERAL: [facility], alert and cooperative, and also  reevaluated now up on the hospital room. He is thin. Neurologic exam is grossly nonfocal. Cranial nerves are intact. Moving all extremities against gravity and ambulating slowly. No jaundice.  MOUTH: No thrush.  LYMPH NODES: Not palpable in neck, supraclavicular, submandibular, or axilla.  LUNGS: Decreased air entry, but no wheezing or rhonchi or rales.  ABDOMEN: Nontender. No palpable mass or organomegaly.  HEART: Regular. No edema. There is muscle wasting. No significant bruising.   DIAGNOSTIC DATA:  Creatinine is 3.89 where the prior creatinine was 2.03. The potassium was 5.7 and sodium 132. The hemoglobin is 9.7, white count 10.2, platelets 443. Chest x-ray shows existing _____ mass and hilar mass, but increasing hilar infiltrate compatible with postobstructive pneumonia and also possibly less likely progressive malignancy.   IMPRESSION:  The patient has acute on chronic renal failure, his p.o. hydration has been fairly adequate, but it could be prerenal element possibly the effects also of antibiotics. He has been on p.o. Levaquin. He has [name] productive cough, persistent, without fever or chills and not short of breath, but compatible with now x-ray evidence of pneumonia with increasing infiltrate. He has underlying slowly progressive bladder cancer and chronic renal failure. He has diabetes currently controlled. He has history of hypertension. Currently, blood pressure is unremarkable.   PLAN:  The patient was initially considered for observation for fluids and Kayexalate, but now that we see the infiltrate and pneumonia, he will need also treatment for that with intravenous antibiotics at least initially and serial x-ray. He will start on saline 200 mL  an hour for [name] liter and then reduce the IV fluids. We gave him [name] dose of Kayexalate and will repeat electrolytes later in the day and then tomorrow. We started him on ceftriaxone IV and Zithromax p.o. We will repeat the x-ray in 2 days' time. We will  culture the sputum. Chemotherapy is on hold.    ____________________________ Knute Neu. Lorre Nick, MD rgg:si D: 04/25/2012 15:04:16 ET T: 04/25/2012 17:02:27 ET JOB#: [id number]  cc: Knute Neu. Lorre Nick, MD, <Dictator> Marin Roberts MD ELECTRONICALLY SIGNED 06/06/2012 14:17

## 2014-08-06 NOTE — Discharge Summary (Signed)
PATIENT NAME:  Donald [name], Martel A MR#:  [id number] DATE OF BIRTH:  1929-07-22  DATE OF ADMISSION:  11/21/2012 DATE OF DISCHARGE:  11/24/2012  PRESENTING COMPLAINT: Altered mental status and dehydration.   DISCHARGE DIAGNOSES: 1.  Metastatic bladder cancer.  2.  Severe dehydration with acute on chronic renal failure.  3.  Severe hyperkalemia.  4.  Adult failure to thrive.  5.  Hyperlipidemia.  6.  History of lung cancer.  7.  Type 2 diabetes.   CODE STATUS: NO CODE, DO NOT RESUSCITATE.   The patient was discharged to hospice home.   CONSULTATIONS:   1.  Palliative Care, Dr. Harvie JuniorPhifer.  2.  Nephrology, Dr. Wynelle LinkKolluru.  BRIEF SUMMARY OF HOSPITAL COURSE: [name] is an 81 year old African American gentleman with history of metastatic widespread bladder cancer with widespread metastasis brought to the Emergency Room with acute on chronic renal failure and severe dehydration. He was admitted with:   1.  Severe recurrent hyperkalemia and acute on chronic CKD, stage III. The patient has bilateral hydronephrosis with stent placement in the past. He has progressive metastatic bladder cancer. He received Kayexalate D50 glucose and insulin. He was started on aggressive IV hydration, potassium came down to 4.5.  2.  Acute on chronic renal failure secondary to severe dehydration with prerenal azotemia. The patient's baseline creatinine around 2.7, came in with 6.7, was trended down to 4.7 prior to discharge to hospice home with IV fluids.  3.  Metastatic bladder cancer. The patient's overall prognosis is very poor, untreatable at this time. Palliative Care saw the patient discussed with the patient and wife and plan was to discharge and the family was agreeable to transfer the patient to hospice home.  4.  relative  hypotension due to dehydration.  5.  Type 2 diabetes, not on any treatment.  6.  Hyponatremia, hypokalemia were being repleted.  7.  The patient remained a NO CODE, DO NOT RESUSCITATE.    TIME SPENT: 40 minutes.    ____________________________ Wylie HailSona A. Allena KatzPatel, MD sap:dp D: 11/25/2012 06:58:37 ET T: 11/25/2012 07:10:02 ET JOB#: [id number]  cc: Rebeca Valdivia A. Allena KatzPatel, MD, <Dictator> Willow OraSONA A Niah Heinle MD ELECTRONICALLY SIGNED 12/05/2012 5:41

## 2014-08-06 NOTE — Op Note (Signed)
PATIENT NAME:  Donald [name], Donald [name] MR#:  [id number] DATE OF BIRTH:  December 17, 1929  DATE OF OPERATION:  08/11/2012  DATE OF BIRTH: December 17, 1929   PREOPERATIVE DIAGNOSES: 1.  Bilateral ureteral obstruction.  2.  Metastatic transitional cell carcinoma of the bladder.   POSTOPERATIVE DIAGNOSES: 1.  Bilateral ureteral obstruction.  2.  Metastatic transitional cell carcinoma of the bladder.   PROCEDURES: 1.  Cystoscopy, with bilateral double pigtail stent exchange.  2.  Fluoroscopy.   SURGEON: Evelene CroonWolff.  ANESTHETIST: Adams.   ANESTHESIC METHOD: General and local, per surgeon.   INDICATIONS: See the dictated history and physical. After informed consent, the patient requested the above procedures.   OPERATIVE SUMMARY: After adequate general anesthesia had been obtained the patient was placed into the dorsal lithotomy position and the perineum was prepped and draped in the usual fashion. [name] 21-French cystoscope was coupled with the camera and then visually advanced into the bladder. The bladder was thoroughly inspected. The patient had irregular bladder mucosa and fibrosis. He also appeared to have some necrotic tumor in the midline posteriorly. Both ureteral stents were identified and appeared to be moderately encrusted. The left stent was removed with the alligator forceps. [name] 0.035 Glidewire was then advanced up the left ureteral orifice using fluoroscopic guidance. [name] 6 x 26 cm double pigtail stent was then advanced over the guidewire and positioned in the ureter using fluoroscopic guidance. The guidewire was removed, taking care to leave the stent in position. The procedure was repeated on the right side in an identical fashion. The bladder was then drained and the cystoscope was removed; 10 mL of viscous Xylocaine was instilled within the urethra and the bladder. [name] B and O suppository was placed. The procedure was then terminated and the patient was transferred to the recovery room in stable condition.      ____________________________ Suszanne ConnersMichael R. Evelene CroonWolff, MD mrw:dm D: 08/11/2012 08:36:32 ET T: 08/11/2012 08:42:26 ET JOB#: [id number]  cc: Suszanne ConnersMichael R. Evelene CroonWolff, MD, <Dictator> Knute Neuobert G. Lorre NickGittin, MD Orson ApeMICHAEL R WOLFF MD ELECTRONICALLY SIGNED 08/11/2012 9:13

## 2014-08-06 NOTE — H&P (Signed)
PATIENT NAME:  Donald [name], [name] MR#:  [id number] DATE OF BIRTH:  [birth year]  DATE OF ADMISSION:  10/29/2012  LOCATION: The patient was admitted to 1[facility].  HISTORY OF PRESENT ILLNESS: Donald [name] is an 81 year old patient well known to me and was seen today in the clinic and is admitted from the clinic for acute on chronic renal failure, increased weakness, failure to thrive, hyperkalemia, nausea, vomiting. The patient has progressive bladder cancer. He has chronic renal failure. He has today been shown to have increasing creatinine, and with nausea and vomiting starting this morning, will be unable to maintain p.o. fluids, needs IV fluids, compromised renal function, needs treatment of hyperkalemia.   PAST MEDICAL HISTORY: Includes hypertension, diabetes and multiple prior therapies for bladder cancer. Hydronephrosis and placement and replacement of double pigtail renal stents. He also has hyperlipidemia and has had bladder tumor resections in the past and nephrostomy tubes in the past.   ALLERGIES: No known allergies.   MEDICATIONS:  1. Hyoscyamine 1 tablet 4 times a day. 2. Amlodipine 5 mg once in the morning.  3. Magnesium oxide 400 mg twice a day. 4. Crestor 10 mg once a day.   FAMILY HISTORY: Noncontributory.   SOCIAL HISTORY: Prior chronic alcohol use. Prior smoker.   ADDITIONAL SYSTEM REVIEW:  NEUROLOGICAL: No headache, no dizziness.  CONSTITUTIONAL: He has general weakness. No chills or sweats.  RESPIRATORY: Minimal nonproductive cough. No shortness of breath at rest.  CARDIOVASCULAR: No palpitations or retrosternal chest pain.  GASTROINTESTINAL: No abdominal pain. Some nausea this morning, vomited this morning. He has been moving his bowels regularly. His appetite has been poor. He has been losing weight.  GENITOURINARY: He has had intermittent hematuria. Adequate urine stream. Some nocturia. EXTREMITIES: No extremity edema.  MUSCULOSKELETAL: No back or bone pain.  INTEGUMENTARY:  No rash or bruising.   PHYSICAL EXAMINATION:  GENERAL: Alert, cooperative, developing cachexia. He has no acute complaints.  HEENT: Sclerae: No jaundice. Mouth: No thrush.  NECK: No palpable lymph nodes in the supraclavicular, submandibular or axillae.  LUNGS: Decreased air entry diffusely, more decrease with dullness in the left base.  ABDOMEN: Nontender. No palpable mass or organomegaly.  HEART: Regular.  EXTREMITIES: No extremity edema. No hot or inflamed joints. There is muscle wasting. There is no bruising.   LABORATORY DATA: Today, the hemoglobin is 11, white count and platelets were not recorded at the current time. His sodium is 129, potassium 5.5, CO2 18, calcium 10, BUN 83, creatinine 4.82. His baseline creatinine has been approximately 2.4 range.   IMPRESSION AND PLAN: Progressive azotemia with hyperkalemia. Today, nausea and vomiting. Progressive diffuse cancer, including liver and lung metastasis. Weight loss. Recent urinary tract infection, has had some hematuria. Bilateral hydronephrosis.   PLAN: Hospitalize the patient today. Start IV fluids. Check his uric acid and magnesium and the full CBC. Get a serial CBC. Give him a dose of Kayexalate initially for the potassium. Would repeat the renal ultrasound. Would reconsult urology regarding any possible options, either stent change or nephrostomies. Would consult nephrology regarding fluid management.    ____________________________ Knute Neuobert G. Lorre NickGittin, MD rgg:OSi D: 10/29/2012 10:50:00 ET T: 10/29/2012 11:07:59 ET JOB#: [id number]  cc: Knute Neuobert G. Lorre NickGittin, MD, <Dictator> Marin RobertsOBERT G Theressa Piedra MD ELECTRONICALLY SIGNED 10/29/2012 18:31

## 2014-08-06 NOTE — Discharge Summary (Signed)
PATIENT NAME:  Donald [name], Donald [name] MR#:  [id number] DATE OF BIRTH:  08-11-1929  DATE OF ADMISSION:  04/25/2012 DATE OF DISCHARGE:  04/29/2012  FINAL DIAGNOSES: 1. Acute on chronic renal failure attributed to effects of Levaquin antibiotic plus prerenal azotemia with creatinine significantly improved back to baseline on discharge.  2. Hyperkalemia, transient, from same causes, improved.  3. History of hypertension but blood pressure was low and medications were held.  4. Underlying bladder cancer metastatic to lungs.  5. Underlying anemia of malignancy and azotemia and also elements of infection and prior chemotherapy contributing to anemia.  6. Pneumonia based on clinical history and chest x-ray appearance with possible postobstructive pneumonitis.   HISTORY AND PHYSICAL: As dictated on admission.   HOSPITAL COURSE: The patient was initially given Kayexalate, intravenous fluids and was continued initially was on p.o. Levaquin as an outpatient, but this was changed to IV ceftriaxone and p.o. Zithromax. Antibiotics were continued. Intravenous fluids were continued. Daily his renal function improved. On January 14th he was seen by the nurse practitioner in my absence, but found to be continually improved, strength was better. The creatinine continued to improve so that the patient was stable for discharge. The hemoglobin had come down with hydration, down to 7.9, unmasking [name] true anemia, but was well tolerated, was not clinically significant, did not require transfusion.   The patient was discharged with the following medications: Magnesium oxide 1 tablet orally twice [name] day, Crestor 10 mg once [name] day at night, famotidine 20 mg once [name] day, amlodipine 5 mg once [name] day, which was half of the prior dose, and Ceftin antibiotic 500 mg 1 tablet twice [name] day for 5 days to complete course of antibiotics. Prior medications that were discontinued, not to be taken on discharge, include Levaquin and lisinopril and  hydrochlorothiazide.       The patient had an appointment to see me in the [facility] on January 17th for lab and followup and x-ray followup as well. ____________________________ Knute Neuobert G. Lorre NickGittin, MD rgg:sb D: 05/14/2012 12:16:43 ET T: 05/14/2012 12:25:36 ET JOB#: [id number]  cc: Knute Neuobert G. Lorre NickGittin, MD, <Dictator> Marin RobertsOBERT G GITTIN MD ELECTRONICALLY SIGNED 06/06/2012 14:17

## 2014-08-08 NOTE — H&P (Signed)
PATIENT NAME:  Donald [name], Donald [name] MR#:  [id number] DATE OF BIRTH:  [birth year]  DATE OF ADMISSION:  06/28/2011  PRIMARY CARE PHYSICIAN: Bluford Main, MD   UROLOGIST: Anola Gurney, MD   ONCOLOGIST: Benita Gutter, MD   NEPHROLOGISTS: Mosetta Pigeon, MD and Lamont Dowdy, MD   CHIEF COMPLAINT: Progressive renal failure.   HISTORY OF PRESENT ILLNESS: The patient is a 81 year old male with a past medical history of metastatic urothelial cancer with lung metastasis, diabetes, hypertension, hyperlipidemia, chronic obstructive pulmonary disease, anemia of chronic disease who was recently seen by Dr. Wynelle Link on 06/27/2011 as follow-up from his recent hospitalization and discharge on 06/13/2011. At that time he had acute kidney injury which was thought to be due to carboplatin, carboplatin,  IV contrast, and  a prerenal component. The patient is currently taking only Crestor. He is not taking any amlodipine, lisinopril or Januvia on the recommendations of his physicians. He had a CAT scan on 06/22/2011 which showed bilateral hydronephrosis and bilateral ureteral stents. There were findings concerning for infiltration of the ureters from the patient's known bladder neoplasia, and there were findings consistent with neoplastic involvement of the urinary bladder. The patient was scheduled to have cystoscopy and stent exchange by Dr. Evelene Croon on 07/02/2011; however, Dr. Wynelle Link decided to admit the patient to the hospital for further management in view of his deteriorating kidney function. He was concerned about obstructive uropathy. The patient's creatinine is up to 5.49. The patient denies any uremic symptoms. He denies any nausea or vomiting, abdominal pain, diarrhea or fever.   ALLERGIES: No known drug allergies.   MEDICATIONS: The patient is taking Crestor 10 mg daily.  He is not taking the following medications any longer: Amlodipine 10 mg daily, Januvia 25 mg daily, and Lisinopril 5 mg daily.    PAST  MEDICAL HISTORY:  1. 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  2. Diabetes.  3. Hypertension. 4. Hyperlipidemia. 5. Chronic obstructive pulmonary disease.  6. Ongoing smoking.   PAST SURGICAL HISTORY:  1. Bladder tumor resection 07/2005.  2. CT-guided biopsy of lung mass 04/2006. 3. Placement of bilateral nephrostomy tubes 04/2006.  4. Left-sided insertion of nephrostomy tube 04/2006.  5. Nephrostomy tube internalization 12/2006.  6. Bilateral stent placement 01/2011.   FAMILY HISTORY: Mother had diabetes. Father died at a young age.   SOCIAL HISTORY: The patient reports that he continues to smoke one pack over two days. He is married and lives with his wife. He occasionally drinks alcohol, however, has not done so for the past two weeks.    REVIEW OF SYSTEMS: ONSTITUTIONAL: Patient denies any fever, fatigue, weakness. EYES: Denies any blurred or double vision. ENT: Denies any tinnitus, ear pain. RESPIRATORY: Denies any cough, wheezing. CARDIOVASCULAR: Denies any chest pain, palpitations. GI: Denies any nausea, vomiting, diarrhea, or abdominal pain. GU: Denies any polyuria, nocturia. HEMATOLOGIC/LYMPHATIC: Denies any anemia or easy bruisability. INTEGUMENTARY: Denies any acne or rash. MUSCULOSKELETAL: Denies any swelling or gout. NEUROLOGICAL: Denies any numbness or weakness. PSYCHIATRIC: Denies any anxiety or depression.   PHYSICAL EXAMINATION:  VITAL SIGNS: Temperature 98.2, heart rate 86, respiratory rate 18, blood pressure 124/73, 98% on room air.   GENERAL: The patient is an elderly African American male lying comfortably in bed, not in acute distress.   HEENT: Head: Atraumatic, normocephalic. Eyes: There is pallor. No icterus or cyanosis. Pupils equal, round, and reactive to light and accommodation. Extraocular movements intact. ENT: Wet mucous membranes. No oropharyngeal erythema  or thrush.   NECK: Supple. No masses. No  JVD. No thyromegaly. No lymphadenopathy.   CHEST WALL: No tenderness to palpation. Not using accessory muscles of respiration. No intercostal muscle retractions.   LUNGS: Bilaterally clear to auscultation. No wheezing, rales, or rhonchi.   CARDIOVASCULAR: S1, S2 regular. There is a systolic murmur, no rubs or gallops.   ABDOMEN: Soft. No guarding, no rigidity. There is mild tenderness to palpation in the paraumbilical region. Normal bowel sounds.   SKIN: No rashes or lesions. However, he has extremely dry skin on his lower extremities.   PERIPHERIES: Trace pedal edema, 1+ pedal pulses.   GU: The patient has an indwelling Foley catheter with gross hematuria/blood mixed with urine in his Foley bag.   MUSCULOSKELETAL: No cyanosis or clubbing.   NEUROLOGICAL: Awake, alert, oriented x3. Nonfocal neurological exam. Cranial nerves are grossly intact.   PSYCHIATRIC: Normal mood and affect.   LABORATORY, DIAGNOSTIC AND RADIOLOGICAL DATA:  White count 6.0, hemoglobin 9.6, hematocrit 29.2, platelets 368. Glucose 76, BUN 96, creatinine 5.49, sodium 130, potassium 5.0, chloride 99, CO2 17.   ASSESSMENT/PLAN: A 81 year old male with past medical history of metastatic transitional cell cancer of the bladder, status post chemotherapy and radiotherapy, benign prostatic hypertrophy, hyperlipidemia, hypertension, diabetes, presents with acute renal failure.   1. Acute renal failure: Possibility of obstructive uropathy, especially in view of concerning findings of infiltration of the ureters from the patient's bladder cancer. We will admit the patient to the hospital. We will get Urology and a Nephrology consultation. We will give the patient gentle IV fluid hydration.  2. Gross hematuria: The patient is having gross hematuria, likely due to his bladder cancer. We will monitor his hemoglobin and hematocrit carefully and obtain a Urological consultation.  3. Metastatic transitional cell cancer of the  bladder, status post chemotherapy and radiation therapy: We will get Oncology consultation.  4. Anemia, likely of chronic disease/chronic kidney disease: Monitor closely.  5. Hyponatremia: Likely due to her acute renal failure.  6. Acidosis: Again, likely due to acute renal failure. 7. Diabetes: The patient is currently not on any diabetic medication. We will place him on a diabetic diet and sliding-scale insulin.  8. History of hypertension: The patient has been taken off all his antihypertensive medications by his physicians. He appears to have well controlled pressure at present. We will continue to monitor his vital signs.  9. Hyperlipidemia: We will continue Crestor.  10. Prophylaxis: We will place on GI and deep vein thrombosis prophylaxis.   I reviewed old medical records, discussed with the patient the plan of care and management.   TIME SPENT: 75 minutes.   ____________________________ Darrick MeigsSangeeta Bertice Risse, MD sp:cbb D: 06/28/2011 19:40:25 ET T: 06/29/2011 07:40:02 ET JOB#: [id number]  cc: Darrick MeigsSangeeta Jameia Makris, MD, <Dictator> Silas FloodSheikh A. Ellsworth Lennoxejan-Sie, MD Darrick MeigsSANGEETA Tamakia Porto MD ELECTRONICALLY SIGNED 06/29/2011 17:34

## 2014-08-08 NOTE — H&P (Signed)
PATIENT NAME:  Nicoletta BaBALDWIN, Inez A MR#:  [id number] DATE OF BIRTH:  1929/06/04  DATE OF ADMISSION:  06/11/2011  HISTORY OF PRESENT ILLNESS:  [name] is a 81 year old patient who is admitted after evaluation in the Cancer Center with rising creatinine. The patient has bladder cancer known to be metastatic to the lung. He is status post chemotherapy as well as palliative radiation to the bladder. His most recent chemotherapy was Taxol and carboplatin, recently treatment held with plateau in response and no continued response to most recent treatment. Also creatinine, which was mildly elevated on 02/11, is now up from 1.74 to 4 today. The patient has no fever, chills, or sweats. No suprapubic pain or discomfort. No increased edema. Adequate p.o. intake.  He reports  normal urine output. He has recently seen primary care. He discontinued metformin on 02/11.  He has continued to take hydrochlorothiazide and lisinopril. He had started Januvia in the last week.   REVIEW OF SYSTEMS:  Additional system review today- the patient feels fairly vigorous and does not have headache, dizziness, cough, chills, or sweats. No shortness of breath at rest or on mild exertion. No palpitation. No retrosternal chest pain. No abdominal pain. No vomiting. No diarrhea. He reports adequate p.o. intake and adequate urine flow and urine output with no hematuria or dysuria and no increased edema. No back or bone pain.   PAST MEDICAL HISTORY:  1. Stage IV metastatic transitional cancer from the bladder noted to the lung.  2. Hypertension. 3. Diabetes.  4. Hyperlipidemia.  5. Bladder resection in April 2007.  6. Lung biopsy in January 2008.  7. Cystoscopy and bladder resection also 2008. 8. Nephrostomies in 2008. Internalized nephrostomies in 2008. 9. Most recent October 2012 change of bilateral stents.   FAMILY HISTORY: Noncontributory.   SOCIAL HISTORY: Alcohol use in the past. Cigarette smoking  history.   MEDICATIONS: 1. Colace twice a day.  2. Oxygen overnight at 2 liters.  3. Crestor 40 mg daily.  4. Amlodipine 10 mg daily.  5. Hydrochlorothiazide 25 and lisinopril 20 mg daily.  6. Januvia daily.   PHYSICAL EXAMINATION:  GENERAL: Alert and cooperative, in no acute distress.   HEENT: Sclerae no jaundice.   MOUTH: No thrush.   NECK: No mass.   LYMPH: No palpable lymph nodes in the neck supraclavicular, submandibular, or axilla.   LUNGS: Decreased air entry in all lung fields. No dullness. No wheezing or rales.   ABDOMEN: Nontender. No palpable mass or organomegaly.   EXTREMITIES: No extremity edema.   NEUROLOGIC: Grossly nonfocal.   HEART: Regular.   LABORATORY DATA: Creatinine is 4, BUN 94, potassium and CO2 are normal. Liver chemistries are pending. CBC is unremarkable. Mild anemia.   IMPRESSION AND PLAN: The patient will be hospitalized for intravenous fluid.  We will do a bladder scan and if he has more than 100-mL postvoid residual, we will put in a Foley catheter. Can get also ultrasound of the kidneys, but he has chronic dilated appearance of the collecting system and we discussed with urology with the stents in place imaging is unlikely to show any obvious changes. We will hold on his diuretics and hold amlodipine and hold Januvia.    We will start low flow IV fluid and chart urine output and get serial creatinine. Consult his primary care and nephrology.      ____________________________ Knute Neuobert G. Lorre NickGittin, MD rgg:bjt D: 06/11/2011 10:32:21 ET T: 06/11/2011 10:49:31 ET JOB#: [id number]  cc: Knute Neuobert G. Lorre NickGittin, MD, <  Dictator> Marin Roberts MD ELECTRONICALLY SIGNED 06/11/2011 14:03

## 2014-08-08 NOTE — Consult Note (Signed)
PATIENT NAME:  Donald Donald [name], Donald Donald [name] MR#:  [id number] DATE OF BIRTH:  [birth year]  DATE OF CONSULTATION:  03/09/2011  REFERRING PHYSICIAN:  Dr. Delfino LovettVipul Shah  CONSULTING PHYSICIAN:  Marin OlpJay H. Alyssia Heese, MD  REASON FOR CONSULTATION: Acute renal insufficiency secondary to obstructive uropathy.   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 The patient is undergoing chemotherapy and has periodic bilateral ureteral stent exchanges for bilateral ureteral obstruction. The patient's urologist is Dr. Anola GurneyMichael Wolff with the last ureteral stent exchange occurring on 01/22/2011. The patient was admitted to the hospitalist service today for acute renal insufficiency felt to be secondary to bladder outlet obstruction with greater than 600 mL obtained with Foley catheter placement in the Emergency Department. Urology is being consulted for assistance in managing the obstructive uropathy. The patient reports Donald [name] one month history of increasing frequency and urgency with occasional urge urinary incontinence. He would go as frequently as every 15 minutes during the daytime and would awaken 4 to 5 times at night. He slept with Donald [name] bucket at the bedside to facilitate managing the nocturia. Approximately one week ago, the patient developed intermittent gross hematuria with "kool aid" discoloration of the urine. The patient denies any dysuria or fevers or chills. The patient was placed on ciprofloxacin 500 mg p.o. b.i.d. empirically on 03/01/2011. Donald [name] urine culture at that time grew out greater than 100,000 colonies of gram-positive rods. Further identification of the bacteria was not performed. Over the past 48 hours, the patient reports developing progressive bilateral flank pain as well as suprapubic discomfort. The patient had lab work drawn this morning which revealed Donald [name] serum creatinine of 6.25 which was markedly elevated compared to his  baseline of 1.06 from 02/22/2011. The patient was contacted and advised to present directly to the Emergency Department for further evaluation. In the Emergency Department, Donald [name] urinalysis was obtained which revealed 681 white blood cell count per high-power field as well as 288 red blood cells per high-power field with 1+ bacteria (nitrite negative). Donald [name] renal ultrasound was obtained which revealed moderate bilateral hydronephrosis which was felt to be stable compared with Donald [name] CT scan of 01/31/2011. Bilateral ureteral stents were noted to be in position. Donald [name] post void residual was calculated at 355.1 mL. Additional findings include Donald [name] stable 2.1 cm probable right lower pole renal cyst as well as increased echogenicity with increased vascularity in the collecting systems raising the question of possible urothelial lesions. Donald [name] Foley catheter was placed at 4:35 p.m. in the Emergency Department ([address] [place]) with Donald [name] prompt return of 600 mL of cloudy blood urine.    PAST MEDICAL HISTORY: 1. Metastatic bladder cancer with bilateral ureteral obstruction and bilateral lungs metastases.  2. Hyperlipidemia. 3. Emphysema/chronic obstructive pulmonary disease. 4. Type 2 diabetes mellitus.  5. Status post left MCA cerebrovascular accident in [id number].  6. Hypertension.  PAST SURGICAL HISTORY: 1. April 2007 TURBT (Dr. Anola GurneyMichael Wolff). 2. May 06, 2006 TURBT (Dr. Anola GurneyMichael Wolff).  3. Status post left inguinal hernia repair 1992 or 1993.   FAMILY HISTORY: Noncontributory.   SOCIAL HISTORY: Patient reports occasional alcohol use with one shot of Segram's VO weekly. Patient denies any history of dependence, DTs. Tobacco-Patient is an active smoker x65 years smoking up to 3 packs per day and is currently down to 1 pack every three days for the past five years.   ALLERGIES: No known drug allergies.  MEDICATIONS: 1. Hydrochlorothiazide/lisinopril (25/20 mg) 1  p.o. q.Donald [name].m.  2. Metformin ER 500 mg 1 p.o. b.i.d. (patient reports  having taken only one dose this past week). 3. Amlodipine 10 mg 1 p.o. q.Donald [name].m. 4. Crestor 20 mg 1 p.o. q.Donald [name].m.  5. Nocturnal oxygen 2 liters per minute p.r.n. 6. Tylox 5 mg/500 mg 1 p.o. q.6 hours p.r.n.  7. Colace 200 mg p.o. daily.  REVIEW OF SYSTEMS: GENERAL: Patient denies any fever or chills. HEENT: Patient denies any recent upper respiratory tract infections. RESPIRATORY: Patient denies any shortness of breath or hemoptysis. CARDIOVASCULAR: Patient denies any chest pain. GASTROINTESTINAL: Patient denies any nausea or vomiting but does report constipation for the past two weeks with last bowel movement being approximately one week ago after taking stool softeners passing small, hard stool. MUSCULOSKELETAL: Patient reports chronic right knee arthritis. LYMPHATIC: Patient denies any adenopathy. HEMATOLOGIC: Patient denies any bleeding diathesis. SKIN: Patient denies any lesions or rashes. PSYCH: Patient denies any depression or anxiety.   PHYSICAL EXAMINATION: VITAL SIGNS: Temperature 97.8 degrees Fahrenheit, blood pressure 106/70, heart rate 81 (regular), respiratory rate 16, oxygen saturation 99% on room air.   GENERAL: Well developed, well nourished [place] American male, pleasant and cooperative in no apparent distress.  HEENT: Normocephalic, atraumatic, anicteric. Extraocular movements are intact.   NECK: No bruits, no masses, no cervical or supraclavicular adenopathy.   CHEST: Clear to auscultation with distant breath sounds. Normal respiratory effort.   CARDIOVASCULAR: Regular rate and rhythm (distant) without murmurs, gallops or rubs. Trace carotids bilaterally with 2+ radial pulses bilaterally. No lower extremity edema.   ABDOMEN: Soft, nontender, nondistended abdomen with normoactive bowel sounds x4, no costovertebral angle tenderness.   GENITOURINARY: Normal circumcised phallus with Donald [name] 59 French Foley catheter in place draining clear amber urine with bilaterally distended testes  that were nontender and without masses.   RECTAL: Digital rectal examination of the prostate revealed Donald [name] 1+ enlarged, firm prostate, nontender without nodules. Rectum was empty without masses or stool. External hemorrhoids appreciated.   EXTREMITIES: No peripheral edema.  NEUROLOGICAL: Alert and oriented x4, nonfocal.   LABORATORY, DIAGNOSTIC AND RADIOLOGICAL DATA: Sodium 124, potassium 4.3, chloride 90, carbon dioxide 21, BUN 114, creatinine 6.25, glucose 195, calcium 9.4, white blood cell count 14.9, hematocrit 35.9, platelet count 382. Urinalysis as per history of present illness. Renal ultrasound as per history of present illness. It should be noted that Donald [name] repeat ultrasound was obtained at 5:56 p.m. after placement of the Foley catheter which revealed persistent moderate bilateral hydronephrosis with good decompression of the bladder with the Foley catheter.   ASSESSMENT:  1. Acute on chronic renal insufficiency due to urinary retention with bladder outlet obstruction most likely due to prostatic enlargement. Patient denies any prior lower urinary tract symptoms or voiding difficulty.  2. Metastatic urothelial cell carcinoma of the urinary bladder. The patient is status post bilateral ureteral stent exchange on 01/22/2011 and continues on chemotherapy with Dr. Haze Rushing.   RECOMMENDATIONS:  1. Keep the Foley catheter in place for at least five days and until the serum creatinine plateaus. Would expect approximately 1 mg percent improvement in the serum creatinine on Donald [name] daily basis.  2. Consider initiating alpha blockade with Tamsulosin 0.4 mg p.o. daily 30 minutes after the same meal after the acute renal insufficiency has resolved.  3. The patient is at risk for an inappropriate post obstructive diuresis. He should have two pitchers of cool water at the bedside for p.r.n. intake as per his normal thirst mechanism.  4. Agree with reculturing the urine and  blood and with empiric antibiotic therapy  with ceftriaxone.   Thank you very much for the opportunity to participate in the care of this most pleasant gentleman. We will follow along closely.   ____________________________ Marin Olp, MD jhk:cms D: 03/09/2011 21:59:27 ET T: 03/10/2011 10:16:38 ET JOB#: [id number]  cc: Marin Olp, MD, <Dictator> Marin Olp MD ELECTRONICALLY SIGNED 06/01/2011 15:55

## 2014-08-08 NOTE — Consult Note (Signed)
He has metastatic transitional cell carcinoma bladder and is followed by Dr. Evelene CroonWolff and Dr. Neale BurlyGitten.  He had bilateral ureteral obstruction currently managed with indwelling stents.  He has had renal failure felt to be multifactorial.  He is scheduled for stent change by Dr. Evelene CroonWolff on 07/02/2011 however was admitted on 3/14 for a rise in his creatinine to 5.49.  His creatinine on 3/11 was 4.77.  Repeat creatinine today was 4.97.  He denies flank pain.  He has an indwelling Foley and has intermittent hematuria. Abdomen soft.  Foley catheter draining slightly blood-tinged urine. Metastatic bladder cancer with bilateral ureteral obstruction.  Admitted for a rise in his creatinine 5.49 however this has improved. Would proceed with bilateral stent exchange by Dr. Evelene CroonWolff on 3/18 as previously planned.  I have left a message with Dr. Selena BattenKim, the covering urologist for the weekend, about this patient.  Should there be any acute change requiring more urgent intervention please reconsult.  His preoperative orders were copy to his current admission.  Electronic Signatures: Riki AltesStoioff, Jareth Pardee C (MD)  (Signed on 15-Mar-13 18:02)  Authored  Last Updated: 15-Mar-13 18:02 by Riki AltesStoioff, Sankalp Ferrell C (MD)

## 2014-08-08 NOTE — Consult Note (Signed)
Brief Consult Note: Diagnosis: metastatic bladder cancer, azotemia progressive, anemia, bladder outlet obstruction.   Patient was seen by consultant.   Comments: PATIENT SEEN AND CHART REVIEWED EARLY EVENING 3/15, AND THIS NOTE ENTERED WITH THAT TIME FOR CONTINUITY...ACTUALLY I HAVE PLACED THIS NARRATIVE ON THE CHART LATER, ON 3/16 IN AM....Marland Kitchen.NOTHINGN CAUTELY FROM HEMATOLOGY. PATIENT SEEN BY ME RECENTLY AND FREQUENTLY,  HE RECENTLY HAD STABLE DISEASE BUT NO FURTHUR IMPROVEMENT WITH TAXOL AND CARBO, HE IS BEING FOLLOWED FOR POSSIBLE SALVAGE [place] WITH GENCYTABINE.  RECENT PROBLEMS WITH RISING CRE MULTIFACTORIAL AS REVIEWED BY MEDICINE, NEPHROLOGY, SEE ALSO DICTATED NOTE.  ANEMIA OK, WATCH FOR SYMPTOMS OR MORE BLOOD LOSSS WILL FOLLOW.  Electronic Signatures: Marin RobertsGittin, Radek Carnero G (MD)  (Signed 16-Mar-13 11:34)  Authored: Brief Consult Note   Last Updated: 16-Mar-13 11:34 by Marin RobertsGittin, Drew Lips G (MD)

## 2014-08-08 NOTE — Consult Note (Signed)
PATIENT NAME:  Donald Donald [name], Donald Donald [name] MR#:  [id number] DATE OF BIRTH:  [birth year]  DATE OF CONSULTATION:  06/29/2011  CONSULTING PHYSICIAN:  Knute Neuobert G. Lorre NickGittin, MD  HISTORY/REASON FOR CONSULTATION: The patient was seen and evaluated by me on March 15th.  I examined him and placed Donald [name] note on the chart, but this full dictation was delayed and not rendered until today, which is March 20th.   Donald Donald [name] is Donald [name] 81 year old patient known to me. He was admitted on March 14th and then seen by me on March 15th, he was hospitalized with increasing creatinine; and, therefore, he was admitted for intravenous fluid support because of deteriorating kidney function. He was also scheduled to have stent exchange performed on March 18th which later was not performed. The patient already had Donald [name] Foley catheter in place that was previously placed for bladder outlet obstruction. He had had Donald [name] waxing and waning creatinine as an outpatient, but now it was back up to 5.19. He was admitted and put on deep vein prophylaxis, and diabetes was managed, and his anemia was monitored. He was taken off his antihypertensive medications. He was noted to have, which was not Donald [name] new problem, gross hematuria in the Foley. The patient is known to me with anemia of recent renal failure and blood loss, also some contribution from malignancy and prior chemotherapy, but only recently had his hemoglobin dropped. He is status post treatment with Taxol and carboplatin that had been effective for Donald [name] long time, but most recently his scans had showed stable disease but no improvement on those agents. He has had chronic hydronephrosis, chronic obstruction, has stents in place and has been followed by Dr. Evelene CroonWolff. He was due for stent revision. He has recently had Donald [name] creatinine up that looked to be Donald [name] combination of factors, some obstructive uropathy, also sensitive to fluids, possibly related to Donald [name] prior recent CT dye study.   ADDITIONAL PAST MEDICAL HISTORY:   1. Diabetes. 2. Hypertension. 3. Hyperlipidemia. 4. Chronic obstructive pulmonary disease.   PAST SURGICAL HISTORY:  1. Bladder tumor resection in 2007. 2. CT-guided lung biopsy in 2008. 3. Nephrostomy tubes in 2008, internalized subsequently.  4. Bilateral stents in October 2012.  ALLERGIES: He has no known allergies.   MEDICATIONS: Crestor 10 mg daily. He was previously on but had not been taking amlodipine, Januvia or lisinopril.   FAMILY HISTORY: Noncontributory.   SOCIAL HISTORY: Occasional alcohol and some cigarettes.   ADDITIONAL SYSTEM REVIEW: When I spoke to the patient, he was very comfortable and was not having any headache or dizziness, chills, or sweats, visual disturbance or wheezing. No cough, chest pain or palpitation, or retrosternal pain. No nausea or vomiting. No diarrhea. He is not having any pain from the Foley catheter. He is seeing dark urine and blood in the tube. No rash or bruising. No edema. No focal weakness. He was not anxious or subjectively sad.   PHYSICAL EXAMINATION:  GENERAL: He was alert and cooperative, no acute distress, thin-his usual appearance.   HEENT: No thrush.   NECK: No mass in the neck.   LYMPH: No palpable lymph nodes in the axillary, supraclavicular or submandibular.    HEART: Regular, systolic murmur.   LUNGS: Clear with decreased air entry. No wheezing, rales, or rhonchi.   ABDOMEN: Nontender. There is no palpable mass or organomegaly.   GENITOURINARY: Donald [name] Foley is in place with hematuria and dark urine.   EXTREMITIES: No extremity edema. No hot or acutely swollen and inflamed joints.  PSYCHIATRIC: His mood and affect were normal.   LABORATORY DATA: His admission hemoglobin was 9.6 but had dropped to 8.6, and his platelets and white count were unremarkable. His admission creatinine was 5.49.   IMPRESSION AND PLAN: The patient has renal failure with elements of obstructive uropathy and possibly some prerenal element. He had  bladder outlet obstruction but this was relieved. He had Donald [name] dye study recently. He has had previous chemotherapy, including carboplatin. He has hematuria that is likely attributable to persistent bladder cancer. He is scheduled for follow-up and revision of his stents with Dr. Evelene Croon. He has chronic hydronephrosis with stents in place. He has anemia that is multifactorial as noted above. He has Donald [name] history of hypertension. Blood pressure has been low recently.   From the Oncology point of view, I would not give him Procrit for anemia because of the potential to accelerate the growth of the cancer, although it is not Donald [name] curable cancer. This could potentially shorten his survival. We could give him packed red blood cell transfusion on Donald [name] p.r.n. basis if he is symptomatic or were to have active bleeding with Donald [name] drop below 8 grams of hemoglobin. I have been planning to give him salvage treatment with gemcitabine or gemcitabine/carboplatin, depending on his renal and overall status; so we will be holding off on therapy at this time, planning to watch him with noncontrast CT scans, waiting for Urology re-evaluation and the procedure, and then we will continue to follow him in the Cancer Center. There is nothing else acutely from Oncology at this point.   ____________________________ Knute Neu. Lorre Nick, MD rgg:cbb D: 07/04/2011 13:29:24 ET T: 07/04/2011 15:08:39 ET JOB#: [id number]  cc: Knute Neu. Lorre Nick, MD, <Dictator> Marin Roberts MD ELECTRONICALLY SIGNED 07/06/2011 13:48

## 2014-08-08 NOTE — Discharge Summary (Signed)
PATIENT NAME:  Nicoletta BaBALDWIN, Odie A MR#:  [id number] DATE OF BIRTH:  Nov 01, 1929  DATE OF ADMISSION:  06/28/2011 DATE OF DISCHARGE:  07/03/2011  DISCHARGE DIAGNOSES:  1. Acute kidney injury. 2. Hematuria.  3. Metastatic transitional cell cancer of the bladder. 4. Anemia of chronic disease.  5. Diabetes. 6. History of hypertension.  7. Hyperlipidemia.   DISCHARGE MEDICATIONS:  1. Crestor 5 mg, take one by mouth once a day.  2. Magnesium oxide 400 mg, take one by mouth twice a day. 3. Famotidine 20 mg, take one by mouth once a day.   HOSPITAL COURSE: Patient is a 81 year old African American male with past medical history of metastatic transitional cell cancer of the bladder with lung metastasis, diabetes, hypertension, hyperlipidemia, and anemia of chronic disease who was admitted to [facility] on the date of admission after his nephrologist noted worsening renal function with concerns for obstructive uropathy. Please see history and physical examination from date of admission for full details regarding the course of events leading up to his admission. Patient was admitted to the hospital to a standard medical bed. Urology, nephrology, and oncology were all consulted by the admitting hospitalist. Urology and nephrology agreed with managing the patient's acute kidney injury from obstructive uropathy with gentle IV fluid hydration combined with Foley catheterization, and urology also planned for stenting and other procedural intervention if needed on the 18th of March. The former two interventions did result in improvement in renal function. However, the patient ate on the morning of the 18th, so urology decided to cancel the procedure and deemed the patient stable for discharge with outpatient follow up in a timely manner so his renal function can continue to be monitored and so the procedure can be rescheduled. Nephrology did agree that patient was stable for discharge. Patient will  maintain close follow up care with urology, nephrology, hematology/oncology, and internal medicine.   Overall, the patient's course in the hospital was uncomplicated. He was discharged from the hospital in satisfactory condition. On day of discharge, the patient was deemed stable for discharge by internal medicine, urology, and nephrology. He will follow up as noted below.   DIET: ADA diet, renal diet.   ACTIVITY: As tolerated.   FOLLOW UP: Follow up in seven days with urology, within seven days with nephrology, as scheduled with internal medicine, and within 10 to 14 days with hematology/oncology. Patient has been instructed to follow up sooner if needed.   TIME SPENT: Discharge time spent including coordination of patient care, education, and the like was greater than 30 minutes.  ____________________________ Burnett HarryMartin G. Shaune SpittleMayer, PA-C, MSPAS, dictating on behalf of Dr. Ellsworth Lennoxejan-Sie. mgm:cms D: 07/02/2011 19:55:00 ET T: 07/03/2011 12:37:17 ET JOB#: [id number]  cc: Burnett HarryMartin G. Stacie AcresMayer, PA, <Dictator> Mariane DuvalMARTIN G Joylyn Duggin PA ELECTRONICALLY SIGNED 07/03/2011 20:46 Charlesetta GaribaldiSHEIKH A TEJAN-SIE MD ELECTRONICALLY SIGNED 07/06/2011 13:21

## 2014-08-08 NOTE — Consult Note (Signed)
Chief Complaint:   Subjective/Chief Complaint no acute complaints   VITAL SIGNS/ANCILLARY NOTES: **Vital Signs.:   20-Mar-13 13:06   Vital Signs Type Q 4hr   Temperature Temperature (F) 98   Celsius 36.6   Temperature Source oral   Pulse Pulse 94   Respirations Respirations 18   Systolic BP Systolic BP 542   Diastolic BP (mmHg) Diastolic BP (mmHg) 77   Mean BP 97   BP Source Dinamap   Brief Assessment:   Cardiac Regular    Respiratory normal resp effort  clear BS  decreased air entry    Gastrointestinal details normal Nontender    Additional Physical Exam alert and cooperative, some blood in foley   Blood Glucose:  20-Mar-13 07:12    POCT Blood Glucose 75    10:59    POCT Blood Glucose 107  Routine Hem:  20-Mar-13 14:28    Hemoglobin (CBC) 7.7  Routine Chem:  20-Mar-13 14:28    Creatinine (comp) 3.20   eGFR (African American) 24   eGFR (Non-African American) 20   Assessment/Plan:  Assessment/Plan:   Assessment BLADDER CANCER METASTATIC  BLADDER OUTLET OBSTRUCTION  BILATERAL HYDRONEPHROSIS.  DUE FOR STENT REVISION, AND THEN LATER SALVAGE [place]. AZOTEMIA SLOW IMPROVEMENT. MULTIFACTORIAL ANEMIA. WITH CONTINUED HEMATURIA AND AZOTEMIA, HGB FALLING TO 7.7G, MILD TACHYCARDIA, UNDERLYING MALIGNANCY, AND INVASIVE PROCEEDURE PLANNED.Marland KitchenMarland KitchenRECOMMEND TRANSFUSE 1 UNIT PRBC TODAY WITH [facility].Marland KitchenMarland KitchenIF STABLE FOR DISCHARGE FROM MEDICINE WOULD WANT TO SEE IN CANCER CENTER 3/25 OR 3/26.  DISCUSSED PROCRIT, PATIENT DOES NOT WANT WILL POTENTIAL TO ACCELERATE CANCER GROWTH. DISCUSSED SIDE EFFECTS OF TRANSFUSION WHICH MAY INCLUDE BUT NOT LIMITED TO ALLERGIC RX, EXCESS FLUID LEADING TO SOB OR LUNG EDEMA, POSSIBLE INFECTIONS FROM TRANSFUSED BLOOD   Electronic Signatures: Dallas Schimke (MD)  (Signed 20-Mar-13 15:22)  Authored: Chief Complaint, VITAL SIGNS/ANCILLARY NOTES, Brief Assessment, Lab Results, Assessment/Plan   Last Updated: 20-Mar-13 15:22 by Dallas Schimke (MD)

## 2014-08-08 NOTE — Consult Note (Signed)
Requesting physician: Dr. Jory SimsGittenfor consultation: Hematuria of present illness: This 81 year old male with metastatic bladder cancer treated with radiation and chemotherapy.  He is followed by Dr. Evelene CroonWolff.  He had bilateral ureteral obstruction which was initially treated with nephrostomy and presently has an indwelling stents which were last changed in October 2012.  He was admitted with acute renal failure.  He denies any voiding complaints.  A Foley catheter was placed at admission however the volume of urine or bladder scan results are not recorded in the chart.  His Foley catheter yesterday apparently got caught on his gown and hematuria was noted.  He has no complaints today.  His creatinine had decreased and was 3.08. Abdomen is soft.  Foley catheter is draining amber, slightly blood-tinged urine. Hematuria-resolving.Irrigate catheter as needed.  He states he was scheduled to see Dr. Evelene CroonWolff this month but is not sure of the appointment.  Would continue his regular followup.  Please reconsult for any other problems.  Electronic Signatures: Riki AltesStoioff, Scott C (MD)  (Signed on 27-Feb-13 11:27)  Authored  Last Updated: 27-Feb-13 11:27 by Riki AltesStoioff, Scott C (MD)

## 2014-08-08 NOTE — H&P (Signed)
PATIENT NAME:  Donald [name], Almir A MR#:  [id number] DATE OF BIRTH:  [birth year]  DATE OF ADMISSION:  07/02/2011  CHIEF COMPLAINT: Obstruction of the ureters.  HISTORY OF PRESENT ILLNESS: [name] is a 81 year old African American male with invasive and metastatic bladder cancer with ureteral obstruction. He comes in today for cysto with stent exchange. The stents were most recently changed on 01/22/2011.   ALLERGIES: No known drug allergies.   CURRENT MEDICATIONS: Glipizide, simvastatin, lisinopril, and HCTZ.   PAST SURGICAL HISTORY: Transurethral resection of bladder tumors in 2005, 2006, and 2008, as well as several stent exchanges since then.   SOCIAL HISTORY: He has a history of alcohol abuse. He smokes greater than a pack a day.   FAMILY HISTORY: Noncontributory.   PAST AND CURRENT MEDICAL CONDITIONS:  1. Hypertension. 2. Diabetes.  3. Hyperlipidemia.   REVIEW OF SYSTEMS: The patient denies chest pain or shortness of breath.   PHYSICAL EXAMINATION:   GENERAL: A chronically ill-appearing African American male in no distress.   HEENT: Sclerae were clear. Pupils were equal, round, and reactive to light. Extraocular movements were intact.   NECK: Supple. No palpable cervical adenopathy.   LUNGS: Clear to auscultation.  HEART: Regular rhythm and rate without audible murmurs.   ABDOMEN: Soft and nontender abdomen.   GU/RECTAL: Deferred.   IMPRESSION: Invasive bladder cancer with bilateral ureteral obstruction.   PLAN: Cystoscopy with bilateral stent exchange.  ____________________________ Suszanne ConnersMichael R. Evelene CroonWolff, MD [email or web address] D: 06/20/2011 13:45:00 ET T: 06/20/2011 14:10:43 ET JOB#: [id number]  cc: Suszanne ConnersMichael R. Evelene CroonWolff, MD, <Dictator> Orson ApeMICHAEL R Britten Seyfried MD ELECTRONICALLY SIGNED 06/21/2011 11:14

## 2014-08-08 NOTE — Op Note (Signed)
PATIENT NAME:  Donald [name], [name] MR#:  [id number] DATE OF BIRTH:  01/21/1930  DATE OF PROCEDURE:  07/09/2011  PREOPERATIVE DIAGNOSES:  Bilateral ureteral obstruction.  Metastatic bladder cancer.  Benign prostatic hypertrophy.   POSTOPERATIVE DIAGNOSES:  Bilateral ureteral obstruction.  Metastatic bladder cancer.  Benign prostatic hypertrophy.   PROCEDURES PERFORMED: Cystoscopy with bilateral stent exchange.  Fluoroscopy.   SURGEON: Anola GurneyMichael Wolff, MD  ANESTHETIST: Heriberto AntiguaScott Palmer, MD  ANESTHESIA: General.   INDICATIONS: See the dictated history and physical. After informed consent, the patient requested the above procedures.   OPERATIVE SUMMARY: After adequate general anesthesia had been obtained, the patient was placed into the dorsal lithotomy position and the perineum was prepped and draped in the usual fashion. Fluoroscopy indicated the presence of bilateral stents. At this point, the 6821 French cystoscope was coupled with the camera and then visually advanced into the bladder. The bladder was heavily trabeculated. Both stents were identified and appeared to be encrusted. No obvious bladder tumors were identified. The patient had lateral lobe prostatic hypertrophy. At this point, using the alligator forceps, the left stent was engaged and pulled out to the urethral meatus. An attempt was made to pass a guidewire through the lumen of the stent, but this was not successful due to encrustation. Therefore, the stent was fully removed. A 0.035 Glidewire was then advanced up the left orifice under fluoroscopic guidance. A 6 x 26 cm double pigtail stent was then advanced over the guidewire using fluoroscopic guidance. The guidewire was then removed taking care to leave the stent in good position. The procedure was repeated on the right side in an identical fashion. Again this stent was heavily calcified and a guidewire could not be passed through the lumen. At this point, confirmation of proper  placement of both stents was confirmed with fluoroscopy. The bladder was then drained and the scope was removed. The procedure was terminated. The patient tolerated procedure well and was transferred to the Recovery Room in stable condition.  ____________________________ Suszanne ConnersMichael R. Evelene CroonWolff, MD [email or web address] D: 07/09/2011 08:16:07 ET T: 07/09/2011 08:51:31 ET JOB#: [id number]  cc: Suszanne ConnersMichael R. Evelene CroonWolff, MD, <Dictator> Orson ApeMICHAEL R WOLFF MD ELECTRONICALLY SIGNED 07/10/2011 12:00

## 2014-08-08 NOTE — Discharge Summary (Signed)
PATIENT NAME:  Donald [name], Donald [name] MR#:  [id number] DATE OF BIRTH:  1929-10-13  DATE OF ADMISSION:  06/11/2011 DATE OF DISCHARGE:  06/13/2011  FINAL DIAGNOSES:  1. Acute renal failure, felt to be due to ATN, multifactorial with also bladder outlet obstruction and chronic obstruction with bilateral hydronephrosis.  2. Hematuria attributed to Foley trauma and bladder pathology, felt to be old radiation changes plus recurrent possible tumor in the bladder.  3. Stable diabetes and hypertension.   HISTORY AND PHYSICAL: As dictated on admission.   HOSPITAL COURSE: The patient was hospitalized and started on low-volume intravenous fluids at 50 mL of saline per hour plus. When he was noted to have [name] high postvoid residual Foley catheter was placed. He had 450 mL of urine and with the Foley catheter and intravenous fluids his creatinine improved steadily from 4 down to 2.77. Some hematuria was noted but not gross, staining the urine continuously pink. He had one episode of 15 to 30 mL of gross blood after brief Foley trauma. Vital signs remained stable. He had [name] nephrology consultation. His blood pressure and sugar were stable even though antihypertensive and diabetic medications were held. On 02/27 he was stable and was discharged to continue home oxygen at night and Crestor 40 mg daily. But his hydrochlorothiazide, lisinopril, amlodipine, and Januvia were held.  He is on [name] renal diet. He is to have no exertion.  He had [name] Foley catheter in place.  FOLLOWUP:  He had [name] follow-up appointment [facility]' [facility] plus outpatient appointment to follow up with urology, Dr. Evelene CroonWolff.    ____________________________ Knute Neuobert G. Lorre NickGittin, MD rgg:bjt D: 06/20/2011 10:26:17 ET T: 06/20/2011 17:26:31 ET JOB#: [id number]  cc: Knute Neuobert G. Lorre NickGittin, MD, <Dictator> Marin RobertsOBERT G GITTIN MD ELECTRONICALLY SIGNED 06/22/2011 8:38

## 2014-10-27 NOTE — Consult Note (Signed)
    General Aspect 81 year old male seen in my office on 8/28 and scheduled for stent exchange 12/24/11. Last stent exchange on March 18.    No Known Allergies:     Impression Bilateral ureteral obstruction Stage 4 bladder cancer Renal insufficiency    Plan Cysto-bilateral stent exchange on 12/24/11   Electronic Signatures: Orson ApeWolff, Kwesi Sangha R (MD)  (Signed 05-Sep-13 17:22)  Authored: General Aspect/Present Illness, Allergies, Impression/Plan   Last Updated: 05-Sep-13 17:22 by Orson ApeWolff, Alvetta Hidrogo R (MD)
